# Patient Record
Sex: Male | Born: 1935 | Race: White | Hispanic: No | Marital: Married | Smoking: Never smoker
Health system: Southern US, Community
[De-identification: ages and names within clinical notes are randomized; demographics above are authoritative.]

## PROBLEM LIST (undated history)

## (undated) DIAGNOSIS — I10 Essential (primary) hypertension: Secondary | ICD-10-CM

## (undated) DIAGNOSIS — I509 Heart failure, unspecified: Secondary | ICD-10-CM

## (undated) DIAGNOSIS — E119 Type 2 diabetes mellitus without complications: Secondary | ICD-10-CM

## (undated) HISTORY — PX: CARDIAC SURGERY: SHX584

---

## 2006-02-13 ENCOUNTER — Ambulatory Visit: Payer: Self-pay | Admitting: Specialist

## 2006-02-15 ENCOUNTER — Ambulatory Visit: Payer: Self-pay | Admitting: Specialist

## 2006-10-31 ENCOUNTER — Ambulatory Visit: Payer: Self-pay | Admitting: Ophthalmology

## 2012-12-03 ENCOUNTER — Ambulatory Visit: Payer: Self-pay | Admitting: Anesthesiology

## 2012-12-03 ENCOUNTER — Ambulatory Visit: Payer: Self-pay | Admitting: Ophthalmology

## 2013-03-16 ENCOUNTER — Inpatient Hospital Stay: Payer: Self-pay | Admitting: Internal Medicine

## 2013-03-16 LAB — URINALYSIS, COMPLETE
BACTERIA: NONE SEEN
BILIRUBIN, UR: NEGATIVE
Glucose,UR: 50 mg/dL (ref 0–75)
Nitrite: NEGATIVE
Ph: 5 (ref 4.5–8.0)
Protein: >=500
RBC,UR: 2 /HPF (ref 0–5)
SPECIFIC GRAVITY: 1.019 (ref 1.003–1.030)

## 2013-03-16 LAB — BASIC METABOLIC PANEL
Anion Gap: 6 — ABNORMAL LOW (ref 7–16)
BUN: 12 mg/dL (ref 7–18)
CO2: 28 mmol/L (ref 21–32)
CREATININE: 0.78 mg/dL (ref 0.60–1.30)
Calcium, Total: 9.3 mg/dL (ref 8.5–10.1)
Chloride: 97 mmol/L — ABNORMAL LOW (ref 98–107)
EGFR (Non-African Amer.): >60
Glucose: 167 mg/dL — ABNORMAL HIGH (ref 65–99)
Osmolality: 266 (ref 275–301)
Potassium: 4.1 mmol/L (ref 3.5–5.1)
SODIUM: 131 mmol/L — AB (ref 136–145)

## 2013-03-16 LAB — CBC
HCT: 43.5 % (ref 40.0–52.0)
HGB: 14.6 g/dL (ref 13.0–18.0)
MCH: 32.2 pg (ref 26.0–34.0)
MCHC: 33.5 g/dL (ref 32.0–36.0)
MCV: 96 fL (ref 80–100)
Platelet: 134 10*3/uL — ABNORMAL LOW (ref 150–440)
RBC: 4.52 10*6/uL (ref 4.40–5.90)
RDW: 14.7 % — AB (ref 11.5–14.5)
WBC: 9.2 10*3/uL (ref 3.8–10.6)

## 2013-03-16 LAB — TROPONIN I: TROPONIN-I: 0.02 ng/mL

## 2013-03-17 LAB — CBC WITH DIFFERENTIAL/PLATELET
Basophil #: 0 10*3/uL (ref 0.0–0.1)
Basophil %: 0.1 %
Eosinophil #: 0 10*3/uL (ref 0.0–0.7)
Eosinophil %: 0.2 %
HCT: 44.4 % (ref 40.0–52.0)
HGB: 14.8 g/dL (ref 13.0–18.0)
LYMPHS ABS: 1.8 10*3/uL (ref 1.0–3.6)
LYMPHS PCT: 19.4 %
MCH: 32 pg (ref 26.0–34.0)
MCHC: 33.3 g/dL (ref 32.0–36.0)
MCV: 96 fL (ref 80–100)
MONOS PCT: 7.8 %
Monocyte #: 0.7 x10 3/mm (ref 0.2–1.0)
NEUTROS ABS: 6.6 10*3/uL — AB (ref 1.4–6.5)
Neutrophil %: 72.5 %
Platelet: 128 10*3/uL — ABNORMAL LOW (ref 150–440)
RBC: 4.62 10*6/uL (ref 4.40–5.90)
RDW: 14.8 % — ABNORMAL HIGH (ref 11.5–14.5)
WBC: 9.2 10*3/uL (ref 3.8–10.6)

## 2013-03-17 LAB — MAGNESIUM: Magnesium: 1.5 mg/dL — ABNORMAL LOW

## 2013-03-17 LAB — BASIC METABOLIC PANEL
ANION GAP: 6 — AB (ref 7–16)
BUN: 10 mg/dL (ref 7–18)
Calcium, Total: 8.9 mg/dL (ref 8.5–10.1)
Chloride: 94 mmol/L — ABNORMAL LOW (ref 98–107)
Co2: 30 mmol/L (ref 21–32)
Creatinine: 0.82 mg/dL (ref 0.60–1.30)
EGFR (African American): >60
EGFR (Non-African Amer.): >60
Glucose: 157 mg/dL — ABNORMAL HIGH (ref 65–99)
Osmolality: 263 (ref 275–301)
Potassium: 4.1 mmol/L (ref 3.5–5.1)
Sodium: 130 mmol/L — ABNORMAL LOW (ref 136–145)

## 2013-03-17 LAB — CK TOTAL AND CKMB (NOT AT ARMC)
CK, TOTAL: 71 U/L
CK, Total: 71 U/L
CK, Total: 67 U/L
CK-MB: 2.3 ng/mL (ref 0.5–3.6)
CK-MB: 2.4 ng/mL (ref 0.5–3.6)
CK-MB: 2.3 ng/mL (ref 0.5–3.6)

## 2013-03-17 LAB — PROTIME-INR
INR: 2
Prothrombin Time: 22 secs — ABNORMAL HIGH (ref 11.5–14.7)

## 2013-03-17 LAB — TROPONIN I
TROPONIN-I: 0.02 ng/mL
Troponin-I: 0.04 ng/mL
Troponin-I: 0.04 ng/mL

## 2013-03-18 LAB — CBC WITH DIFFERENTIAL/PLATELET
BASOS ABS: 0 10*3/uL (ref 0.0–0.1)
BASOS PCT: 0.2 %
EOS ABS: 0.1 10*3/uL (ref 0.0–0.7)
Eosinophil %: 0.9 %
HCT: 45.1 % (ref 40.0–52.0)
HGB: 15.2 g/dL (ref 13.0–18.0)
LYMPHS ABS: 1.4 10*3/uL (ref 1.0–3.6)
Lymphocyte %: 13 %
MCH: 32.1 pg (ref 26.0–34.0)
MCHC: 33.7 g/dL (ref 32.0–36.0)
MCV: 95 fL (ref 80–100)
MONO ABS: 1 x10 3/mm (ref 0.2–1.0)
Monocyte %: 9 %
NEUTROS ABS: 8.1 10*3/uL — AB (ref 1.4–6.5)
Neutrophil %: 76.9 %
Platelet: 127 10*3/uL — ABNORMAL LOW (ref 150–440)
RBC: 4.73 10*6/uL (ref 4.40–5.90)
RDW: 15 % — ABNORMAL HIGH (ref 11.5–14.5)
WBC: 10.6 10*3/uL (ref 3.8–10.6)

## 2013-03-18 LAB — BASIC METABOLIC PANEL
ANION GAP: 6 — AB (ref 7–16)
BUN: 7 mg/dL (ref 7–18)
CALCIUM: 9 mg/dL (ref 8.5–10.1)
CO2: 29 mmol/L (ref 21–32)
CREATININE: 0.84 mg/dL (ref 0.60–1.30)
Chloride: 99 mmol/L (ref 98–107)
EGFR (African American): >60
Glucose: 130 mg/dL — ABNORMAL HIGH (ref 65–99)
Osmolality: 268 (ref 275–301)
Potassium: 3.8 mmol/L (ref 3.5–5.1)
SODIUM: 134 mmol/L — AB (ref 136–145)

## 2013-03-18 LAB — PROTIME-INR
INR: 2.1
Prothrombin Time: 22.7 secs — ABNORMAL HIGH (ref 11.5–14.7)

## 2013-03-18 LAB — TSH: Thyroid Stimulating Horm: 2.18 u[IU]/mL

## 2013-03-18 LAB — AMMONIA: AMMONIA, PLASMA: 26 umol/L (ref 11–32)

## 2013-03-18 LAB — MAGNESIUM: MAGNESIUM: 1.7 mg/dL — AB

## 2013-03-18 LAB — URINE CULTURE

## 2013-03-18 LAB — HEMOGLOBIN A1C: Hemoglobin A1C: 7.1 % — ABNORMAL HIGH (ref 4.2–6.3)

## 2013-03-19 LAB — BASIC METABOLIC PANEL
Anion Gap: 6 — ABNORMAL LOW (ref 7–16)
BUN: 15 mg/dL (ref 7–18)
CALCIUM: 8.9 mg/dL (ref 8.5–10.1)
CO2: 28 mmol/L (ref 21–32)
Chloride: 100 mmol/L (ref 98–107)
Creatinine: 1.07 mg/dL (ref 0.60–1.30)
EGFR (African American): >60
EGFR (Non-African Amer.): >60
GLUCOSE: 166 mg/dL — AB (ref 65–99)
Osmolality: 273 (ref 275–301)
POTASSIUM: 3.9 mmol/L (ref 3.5–5.1)
Sodium: 134 mmol/L — ABNORMAL LOW (ref 136–145)

## 2013-03-19 LAB — PROTIME-INR
INR: 2.5
Prothrombin Time: 26.4 secs — ABNORMAL HIGH (ref 11.5–14.7)

## 2013-03-19 LAB — MAGNESIUM: MAGNESIUM: 1.7 mg/dL — AB

## 2013-03-20 LAB — PROTIME-INR
INR: 2.5
Prothrombin Time: 26.6 secs — ABNORMAL HIGH (ref 11.5–14.7)

## 2013-03-20 LAB — MAGNESIUM: Magnesium: 1.7 mg/dL — ABNORMAL LOW

## 2013-03-21 LAB — CBC WITH DIFFERENTIAL/PLATELET
Basophil #: 0 10*3/uL (ref 0.0–0.1)
Basophil %: 0.3 %
EOS ABS: 0.4 10*3/uL (ref 0.0–0.7)
EOS PCT: 4.9 %
HCT: 42.2 % (ref 40.0–52.0)
HGB: 14.2 g/dL (ref 13.0–18.0)
LYMPHS ABS: 1.8 10*3/uL (ref 1.0–3.6)
Lymphocyte %: 23 %
MCH: 32.1 pg (ref 26.0–34.0)
MCHC: 33.6 g/dL (ref 32.0–36.0)
MCV: 96 fL (ref 80–100)
MONOS PCT: 9.6 %
Monocyte #: 0.8 x10 3/mm (ref 0.2–1.0)
NEUTROS ABS: 4.9 10*3/uL (ref 1.4–6.5)
NEUTROS PCT: 62.2 %
Platelet: 131 10*3/uL — ABNORMAL LOW (ref 150–440)
RBC: 4.41 10*6/uL (ref 4.40–5.90)
RDW: 14.9 % — AB (ref 11.5–14.5)
WBC: 8 10*3/uL (ref 3.8–10.6)

## 2013-03-21 LAB — MAGNESIUM: MAGNESIUM: 1.8 mg/dL

## 2013-03-21 LAB — PROTIME-INR
INR: 2.6
Prothrombin Time: 27.3 secs — ABNORMAL HIGH (ref 11.5–14.7)

## 2013-03-22 LAB — CULTURE, BLOOD (SINGLE)

## 2013-03-22 LAB — PROTIME-INR
INR: 2.3
Prothrombin Time: 24.7 secs — ABNORMAL HIGH (ref 11.5–14.7)

## 2013-03-23 LAB — PROTIME-INR
INR: 2.4
PROTHROMBIN TIME: 25.9 s — AB (ref 11.5–14.7)

## 2013-05-06 ENCOUNTER — Ambulatory Visit: Payer: Self-pay | Admitting: Internal Medicine

## 2013-05-07 ENCOUNTER — Inpatient Hospital Stay: Payer: Self-pay | Admitting: Internal Medicine

## 2013-05-07 LAB — COMPREHENSIVE METABOLIC PANEL
ALT: 102 U/L — AB (ref 12–78)
Albumin: 3.4 g/dL (ref 3.4–5.0)
Alkaline Phosphatase: 66 U/L
Anion Gap: 9 (ref 7–16)
BUN: 21 mg/dL — ABNORMAL HIGH (ref 7–18)
Bilirubin,Total: 1.6 mg/dL — ABNORMAL HIGH (ref 0.2–1.0)
CALCIUM: 8.5 mg/dL (ref 8.5–10.1)
CREATININE: 1.77 mg/dL — AB (ref 0.60–1.30)
Chloride: 101 mmol/L (ref 98–107)
Co2: 28 mmol/L (ref 21–32)
EGFR (African American): 42 — ABNORMAL LOW
EGFR (Non-African Amer.): 36 — ABNORMAL LOW
Glucose: 175 mg/dL — ABNORMAL HIGH (ref 65–99)
Osmolality: 283 (ref 275–301)
Potassium: 4.4 mmol/L (ref 3.5–5.1)
SGOT(AST): 142 U/L — ABNORMAL HIGH (ref 15–37)
Sodium: 138 mmol/L (ref 136–145)
Total Protein: 7 g/dL (ref 6.4–8.2)

## 2013-05-07 LAB — APTT
Activated PTT: 39.9 secs — ABNORMAL HIGH (ref 23.6–35.9)
Activated PTT: >160 secs (ref 23.6–35.9)

## 2013-05-07 LAB — PROTIME-INR
INR: 2.9
INR: 2.8
Prothrombin Time: 29.5 secs — ABNORMAL HIGH (ref 11.5–14.7)
Prothrombin Time: 28.8 secs — ABNORMAL HIGH (ref 11.5–14.7)

## 2013-05-07 LAB — CBC
HCT: 42.9 % (ref 40.0–52.0)
HGB: 13.7 g/dL (ref 13.0–18.0)
MCH: 31.1 pg (ref 26.0–34.0)
MCHC: 31.9 g/dL — ABNORMAL LOW (ref 32.0–36.0)
MCV: 97 fL (ref 80–100)
Platelet: 101 10*3/uL — ABNORMAL LOW (ref 150–440)
RBC: 4.41 10*6/uL (ref 4.40–5.90)
RDW: 15.5 % — ABNORMAL HIGH (ref 11.5–14.5)
WBC: 10.3 10*3/uL (ref 3.8–10.6)

## 2013-05-07 LAB — CK-MB
CK-MB: 40 ng/mL — AB (ref 0.5–3.6)
CK-MB: 55.9 ng/mL — ABNORMAL HIGH (ref 0.5–3.6)
CK-MB: 56.9 ng/mL — ABNORMAL HIGH (ref 0.5–3.6)

## 2013-05-07 LAB — TROPONIN I
TROPONIN-I: 6.7 ng/mL — AB
TROPONIN-I: 20 ng/mL — AB
Troponin-I: 14 ng/mL — ABNORMAL HIGH

## 2013-05-07 LAB — CK TOTAL AND CKMB (NOT AT ARMC): CK, TOTAL: 437 U/L — AB

## 2013-05-08 LAB — CBC WITH DIFFERENTIAL/PLATELET
Basophil #: 0 10*3/uL (ref 0.0–0.1)
Basophil %: 0.4 %
EOS ABS: 0.2 10*3/uL (ref 0.0–0.7)
EOS PCT: 2 %
HCT: 40.5 % (ref 40.0–52.0)
HGB: 13.4 g/dL (ref 13.0–18.0)
Lymphocyte #: 1.8 10*3/uL (ref 1.0–3.6)
Lymphocyte %: 19 %
MCH: 31.7 pg (ref 26.0–34.0)
MCHC: 33 g/dL (ref 32.0–36.0)
MCV: 96 fL (ref 80–100)
Monocyte #: 0.7 x10 3/mm (ref 0.2–1.0)
Monocyte %: 7.8 %
Neutrophil #: 6.6 10*3/uL — ABNORMAL HIGH (ref 1.4–6.5)
Neutrophil %: 70.8 %
PLATELETS: 96 10*3/uL — AB (ref 150–440)
RBC: 4.22 10*6/uL — AB (ref 4.40–5.90)
RDW: 15.4 % — ABNORMAL HIGH (ref 11.5–14.5)
WBC: 9.4 10*3/uL (ref 3.8–10.6)

## 2013-05-08 LAB — PROTIME-INR
INR: 2.9
INR: 2.4
PROTHROMBIN TIME: 29.5 s — AB (ref 11.5–14.7)
PROTHROMBIN TIME: 25.8 s — AB (ref 11.5–14.7)

## 2013-05-08 LAB — BASIC METABOLIC PANEL
Anion Gap: 9 (ref 7–16)
BUN: 26 mg/dL — AB (ref 7–18)
CHLORIDE: 101 mmol/L (ref 98–107)
CO2: 27 mmol/L (ref 21–32)
Calcium, Total: 8.4 mg/dL — ABNORMAL LOW (ref 8.5–10.1)
Creatinine: 1.43 mg/dL — ABNORMAL HIGH (ref 0.60–1.30)
EGFR (Non-African Amer.): 47 — ABNORMAL LOW
GFR CALC AF AMER: 54 — AB
Glucose: 114 mg/dL — ABNORMAL HIGH (ref 65–99)
Osmolality: 279 (ref 275–301)
Potassium: 3.6 mmol/L (ref 3.5–5.1)
Sodium: 137 mmol/L (ref 136–145)

## 2013-05-08 LAB — LIPID PANEL
Cholesterol: 137 mg/dL (ref 0–200)
HDL: 30 mg/dL — AB (ref 40–60)
Ldl Cholesterol, Calc: 86 mg/dL (ref 0–100)
TRIGLYCERIDES: 103 mg/dL (ref 0–200)
VLDL Cholesterol, Calc: 21 mg/dL (ref 5–40)

## 2013-05-08 LAB — APTT
ACTIVATED PTT: 111.5 s — AB (ref 23.6–35.9)
Activated PTT: 103.5 secs — ABNORMAL HIGH (ref 23.6–35.9)

## 2013-05-08 LAB — MAGNESIUM
MAGNESIUM: 1.4 mg/dL — AB
Magnesium: 1.1 mg/dL — ABNORMAL LOW

## 2013-05-08 LAB — PHOSPHORUS: PHOSPHORUS: 2.9 mg/dL (ref 2.5–4.9)

## 2013-05-08 LAB — HEMOGLOBIN A1C: Hemoglobin A1C: 6.6 % — ABNORMAL HIGH (ref 4.2–6.3)

## 2013-05-09 LAB — CBC WITH DIFFERENTIAL/PLATELET
BASOS PCT: 0.2 %
Basophil #: 0 10*3/uL (ref 0.0–0.1)
EOS ABS: 0.1 10*3/uL (ref 0.0–0.7)
EOS PCT: 1 %
HCT: 37 % — AB (ref 40.0–52.0)
HGB: 12.5 g/dL — AB (ref 13.0–18.0)
LYMPHS PCT: 17.9 %
Lymphocyte #: 1.5 10*3/uL (ref 1.0–3.6)
MCH: 32.1 pg (ref 26.0–34.0)
MCHC: 33.8 g/dL (ref 32.0–36.0)
MCV: 95 fL (ref 80–100)
MONO ABS: 0.8 x10 3/mm (ref 0.2–1.0)
Monocyte %: 9.3 %
NEUTROS ABS: 6.2 10*3/uL (ref 1.4–6.5)
NEUTROS PCT: 71.6 %
PLATELETS: 93 10*3/uL — AB (ref 150–440)
RBC: 3.9 10*6/uL — ABNORMAL LOW (ref 4.40–5.90)
RDW: 15.2 % — AB (ref 11.5–14.5)
WBC: 8.6 10*3/uL (ref 3.8–10.6)

## 2013-05-09 LAB — BASIC METABOLIC PANEL
Anion Gap: 5 — ABNORMAL LOW (ref 7–16)
BUN: 19 mg/dL — ABNORMAL HIGH (ref 7–18)
CALCIUM: 8 mg/dL — AB (ref 8.5–10.1)
Chloride: 100 mmol/L (ref 98–107)
Co2: 29 mmol/L (ref 21–32)
Creatinine: 1.09 mg/dL (ref 0.60–1.30)
EGFR (African American): >60
EGFR (Non-African Amer.): >60
Glucose: 132 mg/dL — ABNORMAL HIGH (ref 65–99)
OSMOLALITY: 272 (ref 275–301)
POTASSIUM: 3.6 mmol/L (ref 3.5–5.1)
SODIUM: 134 mmol/L — AB (ref 136–145)

## 2013-05-09 LAB — APTT
ACTIVATED PTT: 85.7 s — AB (ref 23.6–35.9)
Activated PTT: 64 secs — ABNORMAL HIGH (ref 23.6–35.9)
Activated PTT: 64.9 secs — ABNORMAL HIGH (ref 23.6–35.9)

## 2013-05-09 LAB — MAGNESIUM: Magnesium: 1.5 mg/dL — ABNORMAL LOW

## 2013-05-10 LAB — BASIC METABOLIC PANEL
Anion Gap: 0 — ABNORMAL LOW (ref 7–16)
BUN: 12 mg/dL (ref 7–18)
CREATININE: 0.94 mg/dL (ref 0.60–1.30)
Calcium, Total: 8 mg/dL — ABNORMAL LOW (ref 8.5–10.1)
Chloride: 102 mmol/L (ref 98–107)
Co2: 34 mmol/L — ABNORMAL HIGH (ref 21–32)
EGFR (African American): >60
EGFR (Non-African Amer.): >60
Glucose: 103 mg/dL — ABNORMAL HIGH (ref 65–99)
Osmolality: 272 (ref 275–301)
POTASSIUM: 3.4 mmol/L — AB (ref 3.5–5.1)
Sodium: 136 mmol/L (ref 136–145)

## 2013-05-10 LAB — MAGNESIUM
Magnesium: 1.4 mg/dL — ABNORMAL LOW
Magnesium: 1.4 mg/dL — ABNORMAL LOW

## 2013-05-10 LAB — PROTIME-INR
INR: 2
Prothrombin Time: 22.4 secs — ABNORMAL HIGH (ref 11.5–14.7)

## 2013-05-10 LAB — APTT: Activated PTT: 71 secs — ABNORMAL HIGH (ref 23.6–35.9)

## 2013-05-10 LAB — POTASSIUM: Potassium: 4.2 mmol/L (ref 3.5–5.1)

## 2013-05-11 LAB — HEMOGLOBIN: HGB: 11.4 g/dL — ABNORMAL LOW (ref 13.0–18.0)

## 2013-05-11 LAB — BASIC METABOLIC PANEL
ANION GAP: 2 — AB (ref 7–16)
BUN: 10 mg/dL (ref 7–18)
CALCIUM: 8.2 mg/dL — AB (ref 8.5–10.1)
CHLORIDE: 102 mmol/L (ref 98–107)
Co2: 32 mmol/L (ref 21–32)
Creatinine: 0.95 mg/dL (ref 0.60–1.30)
GLUCOSE: 122 mg/dL — AB (ref 65–99)
Osmolality: 272 (ref 275–301)
POTASSIUM: 4 mmol/L (ref 3.5–5.1)
Sodium: 136 mmol/L (ref 136–145)

## 2013-05-11 LAB — PROTIME-INR
INR: 1.4
Prothrombin Time: 16.9 secs — ABNORMAL HIGH (ref 11.5–14.7)

## 2013-05-11 LAB — PLATELET COUNT: PLATELETS: 89 10*3/uL — AB (ref 150–440)

## 2013-05-11 LAB — APTT
ACTIVATED PTT: 63.9 s — AB (ref 23.6–35.9)
Activated PTT: 81.2 secs — ABNORMAL HIGH (ref 23.6–35.9)

## 2013-05-12 LAB — BASIC METABOLIC PANEL
ANION GAP: 2 — AB (ref 7–16)
BUN: 9 mg/dL (ref 7–18)
CHLORIDE: 100 mmol/L (ref 98–107)
CO2: 33 mmol/L — AB (ref 21–32)
CREATININE: 0.93 mg/dL (ref 0.60–1.30)
Calcium, Total: 8.5 mg/dL (ref 8.5–10.1)
EGFR (African American): >60
GLUCOSE: 158 mg/dL — AB (ref 65–99)
Osmolality: 272 (ref 275–301)
POTASSIUM: 3.6 mmol/L (ref 3.5–5.1)
SODIUM: 135 mmol/L — AB (ref 136–145)

## 2013-05-12 LAB — APTT
Activated PTT: 50.8 secs — ABNORMAL HIGH (ref 23.6–35.9)
Activated PTT: 100.3 secs — ABNORMAL HIGH (ref 23.6–35.9)

## 2013-05-12 LAB — MAGNESIUM
MAGNESIUM: 1.1 mg/dL — AB
MAGNESIUM: 1.3 mg/dL — AB

## 2013-05-12 LAB — PROTIME-INR
INR: 1.3
Prothrombin Time: 15.5 secs — ABNORMAL HIGH (ref 11.5–14.7)

## 2013-05-13 LAB — BASIC METABOLIC PANEL
Anion Gap: 2 — ABNORMAL LOW (ref 7–16)
BUN: 8 mg/dL (ref 7–18)
CO2: 36 mmol/L — AB (ref 21–32)
Calcium, Total: 8.3 mg/dL — ABNORMAL LOW (ref 8.5–10.1)
Chloride: 98 mmol/L (ref 98–107)
Creatinine: 0.81 mg/dL (ref 0.60–1.30)
EGFR (African American): >60
Glucose: 142 mg/dL — ABNORMAL HIGH (ref 65–99)
OSMOLALITY: 273 (ref 275–301)
Potassium: 3.4 mmol/L — ABNORMAL LOW (ref 3.5–5.1)
Sodium: 136 mmol/L (ref 136–145)

## 2013-05-13 LAB — MAGNESIUM: Magnesium: 1.8 mg/dL

## 2013-05-13 LAB — APTT: ACTIVATED PTT: 77.2 s — AB (ref 23.6–35.9)

## 2013-05-13 LAB — PROTIME-INR
INR: 1.8
Prothrombin Time: 20.6 secs — ABNORMAL HIGH (ref 11.5–14.7)

## 2013-05-14 LAB — APTT: Activated PTT: 157 secs — ABNORMAL HIGH (ref 23.6–35.9)

## 2013-05-14 LAB — BASIC METABOLIC PANEL
Anion Gap: 1 — ABNORMAL LOW (ref 7–16)
BUN: 7 mg/dL (ref 7–18)
CHLORIDE: 95 mmol/L — AB (ref 98–107)
Calcium, Total: 8.5 mg/dL (ref 8.5–10.1)
Co2: 38 mmol/L — ABNORMAL HIGH (ref 21–32)
Creatinine: 0.97 mg/dL (ref 0.60–1.30)
EGFR (African American): >60
EGFR (Non-African Amer.): >60
Glucose: 132 mg/dL — ABNORMAL HIGH (ref 65–99)
Osmolality: 268 (ref 275–301)
Potassium: 3.6 mmol/L (ref 3.5–5.1)
Sodium: 134 mmol/L — ABNORMAL LOW (ref 136–145)

## 2013-05-14 LAB — CBC WITH DIFFERENTIAL/PLATELET
BASOS ABS: 0 10*3/uL (ref 0.0–0.1)
BASOS PCT: 0.4 %
EOS ABS: 0.3 10*3/uL (ref 0.0–0.7)
EOS PCT: 4.1 %
HCT: 34.5 % — AB (ref 40.0–52.0)
HGB: 11.5 g/dL — AB (ref 13.0–18.0)
LYMPHS ABS: 1.5 10*3/uL (ref 1.0–3.6)
Lymphocyte %: 19.1 %
MCH: 32 pg (ref 26.0–34.0)
MCHC: 33.4 g/dL (ref 32.0–36.0)
MCV: 96 fL (ref 80–100)
Monocyte #: 0.9 x10 3/mm (ref 0.2–1.0)
Monocyte %: 10.8 %
NEUTROS ABS: 5.2 10*3/uL (ref 1.4–6.5)
NEUTROS PCT: 65.6 %
PLATELETS: 107 10*3/uL — AB (ref 150–440)
RBC: 3.6 10*6/uL — AB (ref 4.40–5.90)
RDW: 15 % — AB (ref 11.5–14.5)
WBC: 8 10*3/uL (ref 3.8–10.6)

## 2013-05-14 LAB — PROTIME-INR
INR: 2.7
Prothrombin Time: 28 secs — ABNORMAL HIGH (ref 11.5–14.7)

## 2013-06-05 ENCOUNTER — Ambulatory Visit: Payer: Self-pay | Admitting: Internal Medicine

## 2013-10-19 ENCOUNTER — Inpatient Hospital Stay: Payer: Self-pay | Admitting: Specialist

## 2013-10-19 LAB — CK TOTAL AND CKMB (NOT AT ARMC)
CK, TOTAL: 50 U/L
CK, TOTAL: 64 U/L
CK, TOTAL: 64 U/L
CK-MB: 2.3 ng/mL (ref 0.5–3.6)
CK-MB: 2.4 ng/mL (ref 0.5–3.6)
CK-MB: 2 ng/mL (ref 0.5–3.6)

## 2013-10-19 LAB — PROTIME-INR
INR: 1.8
Prothrombin Time: 20.3 secs — ABNORMAL HIGH (ref 11.5–14.7)

## 2013-10-19 LAB — CBC
HCT: 36.4 % — AB (ref 40.0–52.0)
HGB: 11.6 g/dL — ABNORMAL LOW (ref 13.0–18.0)
MCH: 31.5 pg (ref 26.0–34.0)
MCHC: 31.8 g/dL — AB (ref 32.0–36.0)
MCV: 99 fL (ref 80–100)
PLATELETS: 127 10*3/uL — AB (ref 150–440)
RBC: 3.68 10*6/uL — ABNORMAL LOW (ref 4.40–5.90)
RDW: 14.1 % (ref 11.5–14.5)
WBC: 5.7 10*3/uL (ref 3.8–10.6)

## 2013-10-19 LAB — COMPREHENSIVE METABOLIC PANEL
ALBUMIN: 3 g/dL — AB (ref 3.4–5.0)
ALK PHOS: 74 U/L
Anion Gap: 8 (ref 7–16)
BUN: 11 mg/dL (ref 7–18)
Bilirubin,Total: 0.6 mg/dL (ref 0.2–1.0)
CHLORIDE: 102 mmol/L (ref 98–107)
CREATININE: 1.05 mg/dL (ref 0.60–1.30)
Calcium, Total: 8.4 mg/dL — ABNORMAL LOW (ref 8.5–10.1)
Co2: 29 mmol/L (ref 21–32)
EGFR (Non-African Amer.): >60
GLUCOSE: 112 mg/dL — AB (ref 65–99)
OSMOLALITY: 278 (ref 275–301)
Potassium: 3.9 mmol/L (ref 3.5–5.1)
SGOT(AST): 38 U/L — ABNORMAL HIGH (ref 15–37)
SGPT (ALT): 68 U/L — ABNORMAL HIGH
Sodium: 139 mmol/L (ref 136–145)
Total Protein: 6.7 g/dL (ref 6.4–8.2)

## 2013-10-19 LAB — URINALYSIS, COMPLETE
Bilirubin,UR: NEGATIVE
GLUCOSE, UR: NEGATIVE mg/dL (ref 0–75)
Ketone: NEGATIVE
Nitrite: NEGATIVE
PH: 5 (ref 4.5–8.0)
Protein: NEGATIVE
SPECIFIC GRAVITY: 1.013 (ref 1.003–1.030)
SQUAMOUS EPITHELIAL: NONE SEEN

## 2013-10-19 LAB — TSH: THYROID STIMULATING HORM: 4.24 u[IU]/mL

## 2013-10-19 LAB — TROPONIN I
Troponin-I: 0.02 ng/mL
Troponin-I: 0.03 ng/mL

## 2013-10-19 LAB — PRO B NATRIURETIC PEPTIDE: B-Type Natriuretic Peptide: 577 pg/mL — ABNORMAL HIGH (ref 0–450)

## 2013-10-20 LAB — COMPREHENSIVE METABOLIC PANEL
ALBUMIN: 2.9 g/dL — AB (ref 3.4–5.0)
ALT: 62 U/L
Alkaline Phosphatase: 76 U/L
Anion Gap: 9 (ref 7–16)
BILIRUBIN TOTAL: 0.6 mg/dL (ref 0.2–1.0)
BUN: 13 mg/dL (ref 7–18)
CO2: 30 mmol/L (ref 21–32)
Calcium, Total: 8.3 mg/dL — ABNORMAL LOW (ref 8.5–10.1)
Chloride: 99 mmol/L (ref 98–107)
Creatinine: 1.13 mg/dL (ref 0.60–1.30)
EGFR (African American): >60
GLUCOSE: 136 mg/dL — AB (ref 65–99)
Osmolality: 278 (ref 275–301)
Potassium: 3.4 mmol/L — ABNORMAL LOW (ref 3.5–5.1)
SGOT(AST): 35 U/L (ref 15–37)
Sodium: 138 mmol/L (ref 136–145)
Total Protein: 6.4 g/dL (ref 6.4–8.2)

## 2013-10-20 LAB — CBC WITH DIFFERENTIAL/PLATELET
BASOS ABS: 0 10*3/uL (ref 0.0–0.1)
BASOS PCT: 0.2 %
EOS ABS: 0.2 10*3/uL (ref 0.0–0.7)
Eosinophil %: 2.6 %
HCT: 35.3 % — AB (ref 40.0–52.0)
HGB: 11.3 g/dL — ABNORMAL LOW (ref 13.0–18.0)
LYMPHS ABS: 1 10*3/uL (ref 1.0–3.6)
LYMPHS PCT: 16.2 %
MCH: 31.6 pg (ref 26.0–34.0)
MCHC: 32.1 g/dL (ref 32.0–36.0)
MCV: 99 fL (ref 80–100)
MONO ABS: 0.6 x10 3/mm (ref 0.2–1.0)
Monocyte %: 10.5 %
NEUTROS PCT: 70.5 %
Neutrophil #: 4.2 10*3/uL (ref 1.4–6.5)
PLATELETS: 120 10*3/uL — AB (ref 150–440)
RBC: 3.58 10*6/uL — AB (ref 4.40–5.90)
RDW: 14.1 % (ref 11.5–14.5)
WBC: 6 10*3/uL (ref 3.8–10.6)

## 2013-10-20 LAB — LIPID PANEL
Cholesterol: 138 mg/dL (ref 0–200)
HDL Cholesterol: 35 mg/dL — ABNORMAL LOW (ref 40–60)
Ldl Cholesterol, Calc: 88 mg/dL (ref 0–100)
Triglycerides: 77 mg/dL (ref 0–200)
VLDL Cholesterol, Calc: 15 mg/dL (ref 5–40)

## 2013-10-20 LAB — PRO B NATRIURETIC PEPTIDE: B-TYPE NATIURETIC PEPTID: 926 pg/mL — AB (ref 0–450)

## 2013-10-21 LAB — PROTIME-INR
INR: 1.8
PROTHROMBIN TIME: 20.5 s — AB (ref 11.5–14.7)

## 2013-10-21 LAB — BASIC METABOLIC PANEL
ANION GAP: 10 (ref 7–16)
BUN: 16 mg/dL (ref 7–18)
CALCIUM: 8.2 mg/dL — AB (ref 8.5–10.1)
CHLORIDE: 96 mmol/L — AB (ref 98–107)
CO2: 31 mmol/L (ref 21–32)
CREATININE: 1.02 mg/dL (ref 0.60–1.30)
Glucose: 114 mg/dL — ABNORMAL HIGH (ref 65–99)
OSMOLALITY: 276 (ref 275–301)
POTASSIUM: 3.3 mmol/L — AB (ref 3.5–5.1)
Sodium: 137 mmol/L (ref 136–145)

## 2013-10-24 LAB — URINE CULTURE

## 2014-05-29 NOTE — H&P (Signed)
PATIENT NAME:  James [name], [name] MR#:  [id number] DATE OF BIRTH:  [birth year]  DATE OF ADMISSION:  03/16/2013  PRIMARY CARE PHYSICIAN: Dr. Maudie Flakesale Feldpausch.   REFERRING EMERGENCY ROOM PHYSICIAN: Dr. Cyril LoosenKinner.   CHIEF COMPLAINT: Altered mental status.   HISTORY OF PRESENT ILLNESS: The patient is a 81 year old Caucasian male with a past medical history of hypertension, chronic atrial fibrillation on Coumadin, diabetes mellitus, COPD and history of heart valve repair. He is brought into the ER by his wife via EMS for being confused for the past week or so. The patient's wife was concerned that for the past 7 days his mental status has been getting worse and he is not eating much. Denies any head injury. She called EMS and at the time, the patient was complaining of headache and neck pain and was also complaining of frequent urination and abdominal pain. As per EMS report, the patient has chronic headache and neck pain, but the neck pain was worse while they were bringing him to the ER. At that time, the patient's temperature was 97.5. Blood sugar was 172. The patient is in atrial fibrillation which is chronic to him. The patient's blood pressure was 120/98 during that time. After coming to the ER, the patient had a CAT scan of the head without contrast which has revealed no focal acute intracranial abnormalities. Urinalysis has revealed trace leuk esterase, and urine cultures were obtained. As the patient was given Haldol 2.5 mg IV and lorazepam 1 mg IV push in the ER, the patient was very lethargic during my examination and I was unable to get any history from him. The patient has received 1 liter fluid bolus, and during my examination, his wife is at bedside. She has reported that the patient takes Coumadin on a daily basis, and she could not recall his home medication list. Home medications are not reconciled during my examination. The patient's blood pressure was high, and it was around 190 systolic during my  examination. Wife is reporting that he did not take any of his medications last night. Blood cultures and urine cultures were ordered, and IV levofloxacin was ordered.   PAST MEDICAL HISTORY: Chronic atrial fibrillation on Coumadin, diabetes mellitus, heart valve repair, COPD, hypertension.   PAST SURGICAL HISTORY: Heart valve repair.   PSYCHOSOCIAL HISTORY: Lives at home with wife. He used to smoke but quit smoking 30 years ago. No history of alcohol or illicit drug usage as reported by wife.   FAMILY HISTORY: Hypertension runs in his family.   HOME MEDICATION LIST: Not available other than Coumadin.   REVIEW OF SYSTEMS: Unobtainable.   PHYSICAL EXAMINATION:  VITAL SIGNS: Temperature 97.8, pulse 68 to 103, respirations 18, blood pressure 191/86, pulse ox 93 percent on room air.  GENERAL APPEARANCE: Not in any acute distress. Moderately built and nourished. Very lethargic. Arousable but falling asleep.  HEENT: Normocephalic, atraumatic. Pupils are equally reactive to light and accommodation. No scleral icterus. No conjunctival injection. No sinus tenderness. No postnasal drip. Dry mucous membranes.  NECK: Supple. No JVD. No thyromegaly. Spontaneously, the patient is turning his neck from side to side and according to the wife, the patient has chronic neck pain.  LUNGS: Clear to auscultation bilaterally. No accessory muscle usage.  CARDIOVASCULAR: Irregularly irregular. Positive murmur.  GASTROINTESTINAL: Soft. Bowel sounds are positive in all 4 quadrants. Nontender, nondistended. No masses felt.  NEUROLOGIC: The patient is very lethargic with altered mental status. Arousable but falling asleep. The patient has received Haldol and lorazepam  IV in the ER. Spontaneously moving his upper and lower extremities. Reflexes are 2+.  MUSCULOSKELETAL: No joint effusion, tenderness or erythema.  SKIN: Warm to touch. Normal turgor. No rashes. No lesions.   LABORATORIES AND IMAGING STUDIES: CAT scan of  the head without contrast has revealed no focal acute intracranial abnormality. Chronic diffuse atrophy. Chronic bilateral periventricular white matter small vessel ischemic change is noticed. Chest x-ray, portable: No acute disease. A 12-lead EKG has revealed Afib with right bundle branch block. No acute ST-T wave changes. Troponin is 0.02. WBC 9.2, hemoglobin 14.6, hematocrit 43.5, platelets are 134. Urinalysis yellow in color, clear in appearance, nitrites negative, leuk esterase trace, protein greater than 500. Urine glucose is 50 mg/dL. Chem-8: Serum glucose 167, BUN and creatinine are normal, sodium 131, potassium 4.1. Anion gap is 6. The rest of the chem-8 is normal.   ASSESSMENT AND PLAN: A 81 year old Caucasian male brought into the Emergency Room via EMS as he was with altered mental status and decreased p.o. intake for the past 1 week. The patient was complaining of headache and neck pain to EMS, but the patient has chronic history of headache and neck pain.  1. Altered mental status, probably from acute cystitis/metabolic encephalopathy. Will keep him n.p.o. as the patient is very lethargic. Will provide him intravenous fluids while he is n.p.o. Blood cultures and urine cultures were ordered. Will provide intravenous levofloxacin.  2. Hypertension, uncontrolled: Probably because he was not compliant with his medications. The patient being n.p.o. as there is a risk for aspiration. Will give him intravenous Cardizem as needed for atrial fibrillation with rapid ventricular response/uncontrolled blood pressure with systolic blood pressure greater than 165.  3. Chronic history of diabetes mellitus: The patient will be on sliding scale insulin.  4. Chronic atrial fibrillation, rate controlled, on Coumadin: PT/INR is ordered stat which is pending at this time. Coumadin management after reviewing PT/INR results.  5. Chronic history of chronic obstructive pulmonary disease: Will provide him DuoNeb  treatments q.6 hours p.r.n. for shortness of breath.  6. He is DO NOT RESUSCITATE. Wife is medical power of attorney.  7. Will provide him gastrointestinal prophylaxis.  8. Deep vein thrombosis prophylaxis: The patient is on Coumadin. Awaiting PT/INR results.   Plan of care discussed in detail with the patient's wife at bedside. She verbalized understanding of the plan.    ____________________________ Ramonita Lab, MD ag:gb D: 03/17/2013 00:42:12 ET T: 03/17/2013 01:12:54 ET JOB#: [id number]  cc: Ramonita Lab, MD, <Dictator> Marina Goodell, MD Ramonita Lab MD ELECTRONICALLY SIGNED 04/02/2013 18:46

## 2014-05-29 NOTE — Discharge Summary (Signed)
PATIENT NAME:  James [name], James [name] MR#:  [id number] DATE OF BIRTH:  1933-11-22  DATE OF ADMISSION:  05/07/2013  DATE OF DISCHARGE:  05/14/2013  ADMISSION DIAGNOSIS:   Non-ST elevation myocardial infarction.    DISCHARGE DIAGNOSES:  1.  Non-ST elevation myocardial infarction.  2.  Atrial fibrillation and RVR.  3.  Acute on chronic systolic heart failure.  4.  Pneumonia.  5.  Hypertension.  6.  History of diabetes.   CONSULTATIONS: Cardiology.   LABORATORIES: White blood cells 8, hemoglobin 11.5, hematocrit 35, platelets 107,000.   INR is 2.7.   HOSPITAL COURSE: A 81 year old male brought into the ER with chest pressure. For further details, please refer to H and P.   TREATMENT PLAN: 1.  Non-ST-elevation myocardial infarction. The patient had an elevation in troponins. He underwent a cardiac catheterization , which showed insignificant CAD, EF fraction was 40%-45%. He was continued on his outpatient medications.  2. Acute on chronic atrial fibrillation with RVR. The patient goes in and out of RVR with a SVT especially when he ambulates. The patient says that he can feel his palpitations. He spent significant time in the stepdown unit trying to get his heart rates under control. When he is at rest his heart rates are 80s-90s. However, on ambulation it does 160s, but subsequently when he is at rest. The patient was started amiodarone drip and then transitioned to amiodarone b.i.d. He is also on diltiazem for better heart rate control and blood pressure control. Cardiology has consulted. The patient is already on Coumadin with an INR of 2.7 at discharge. The patient is well aware that his heart rate does increase and says that on the date of discharge he wanted to go home. Initially he want to go home at discharge. I did talk to the patient that due to his elevation and heart rate, he is at high risk of cardiac arrest as well as pulmonary arrest. The patient accepts these risks and he says he is ready  to go with God and did discuss in this in great detail with his wife. He also discussed this case managed and palliative care. The patient did not want to go with hospice, but once again, it was reiterated to him and his wife that he is at a high risk for cardiac and pulmonary arrest.  2.  History of mechanical heart valve, a St. Jude's heart valve. The patient is on Coumadin. INR is 2.7. He was bridged with heparin.  3.  History of diabetes. The patient will be on sliding scale insulin.  4.  Hypertension. The patient will continued on his outpatient medication.  5.  History of COPD, not an exacerbation.  6.  Hypomagnesemia, which was repleted in the ICU.  7.  History of CHF, systolic acute on chronic with EF of 40%-45%. His chest x-ray was positive for effusion and questionable pneumonia. He was treated with IV Lasix as well as antibiotics for hospital-acquired pneumonia.  8.  Toe ulcer, status post I and D by podiatry on 05/13/2013.    DISCHARGE MEDICATIONS:  1.  Coumadin 3 mg daily.  2.  Tramadol 50 mg b.i.d.  3.  Amiodarone 200 mg b.i.d.  4.  Diltiazem 300 mg daily.  5.  Simvastatin 20 mg at bedtime.  6.  Aspirin 81 mg daily.  7.  Patient was instructed to use sliding scale insulin.    DISCHARGE DIET: Low sodium, ADA diet.   DISCHARGE OXYGEN:   2 liters nasal cannula.  DISCHARGE ACTIVITY: No exertion or heavy lifting.   FOLLOW UP:  Patient will follow up with his primary care physician in 1 week.   TIME SPENT: Approximately 35 minutes.   DISPOSITION:  The patient wanted to go home. We did not discharge him AMA. He accepts the risks and is now DO NOT RESUSCITATE.  ____________________________ James ContesSital P. Juliene PinaMody, MD spm:tc D: 05/14/2013 14:32:43 ET T: 05/14/2013 14:54:10 ET JOB#: [id number]  cc: Solita Macadam P. Juliene PinaMody, MD, <Dictator>             Marina Goodellale E. Feldpausch, MD James ContesSITAL P Akaylah Lalley MD ELECTRONICALLY SIGNED 05/15/2013 12:50

## 2014-05-29 NOTE — Consult Note (Signed)
PATIENT NAME:  James [name], James [name] MR#:  [id number] DATE OF BIRTH:  13-Oct-1933  DATE OF CONSULTATION:  05/07/2013  REFERRING PHYSICIAN:  Altamese DillingVaibhavkumar Vachhani, MD CONSULTING PHYSICIAN:  Lamar BlinksBruce J. Abou Sterkel, MD  REASON FOR CONSULTATION: Acute subendocardial myocardial infarction with valvular heart disease.   CHIEF COMPLAINT: "I had chest pain."   HISTORY OF PRESENT ILLNESS: This is a 81 year old male with known coronary artery disease, hypertension, hyperlipidemia, atrial fibrillation and acute onset of substernal chest discomfort, weakness, fatigue and shortness of breath awakening him from sleep with significant amount of diaphoresis multiple times. When seen in the Emergency Room, he had no evidence of acute myocardial infarction by EKG with sinus rhythm with a right bundle branch block. The patient has been treated with sotalol for maintenance of normal sinus rhythm. He also is on Coumadin for further risk reduction in stroke and thrombosis of mitral valve with replacement of St. Jude valve. The patient's INR is 2.9. The patient was seen and had a troponin of 7.4 with symptoms consistent with NSTEMI of the possible right coronary artery. The patient has had full relief with nitroglycerin and heparin.   REVIEW OF SYSTEMS: The remainder of review is negative for vision change, ringing in the ears, hearing loss, cough, congestion, heartburn, nausea, vomiting, diarrhea, bloody stool, stomach pain, extremity pain, leg weakness, cramping of the buttocks, known blood clots, headaches, blackouts, dizzy spells, nosebleeds, congestion, trouble swallowing, frequent urination, urination at night, muscle weakness, numbness, anxiety, depression, skin lesions or skin rashes.   PAST MEDICAL HISTORY: 1.  Mitral valve replacement with St. Jude. 2.  Hypertension.  3.  Hyperlipidemia.  4.  Atrial fibrillation.  5.  Valvular heart disease and coronary artery disease.   FAMILY HISTORY: No family members with early  onset of cardiovascular disease or hypertension.   SOCIAL HISTORY: Currently denies alcohol or tobacco use.   ALLERGIES: As listed.   MEDICATIONS: As listed.   PHYSICAL EXAMINATION: VITAL SIGNS: Blood pressure is 122/68 bilaterally and heart rate 110 upright and reclining and slightly irregular.  GENERAL: He is a well appearing male in no distress.  HEENT: No icterus, thyromegaly, ulcers, hemorrhage, or xanthelasma.  HEART: Regular rate and rhythm. Norma St. Jude S1 and normal S2 with a 2/6 apical murmur consistent with mitral regurgitation. PMI is diffuse. Carotid upstroke normal without bruit. Jugular venous pressure is normal.  LUNGS: Have few basilar crackles with normal respirations.  ABDOMEN: Soft and nontender without hepatosplenomegaly or masses. Abdominal aorta is normal size without bruit.  EXTREMITIES: 2+ radial, femoral and dorsal pedal pulses with no lower extremity edema, cyanosis, clubbing or ulcers.  NEUROLOGIC: He is oriented to time, place, and person with normal mood and affect.   ASSESSMENT: This is a 81 year old male with hypertension, hyperlipidemia, coronary artery disease, mitral valve replacement and atrial fibrillation having an acute subendocardial myocardial infarction with elevated troponin.   RECOMMENDATIONS: 1.  Serial ECG and enzymes to assess extent of myocardial infarction. 2.  Heparin and discontinuation of Coumadin for myocardial infarction and thrombosis risk.  3.  Continue sotalol for heart rate control and maintenance of normal rhythm.  4.  Proceed to cardiac catheterization to assess coronary anatomy and further treatment thereof as necessary. The patient understands the risks and benefits of cardiac catheterization. These include the possibility of death, stroke, heart attack, infection, bleeding or blood clot. He is at low risk for conscious sedation.  5.  Echocardiogram for LV systolic dysfunction.  6.  Further treatment options after  above.  ____________________________ Lamar Blinks, MD bjk:sb D: 05/07/2013 07:25:32 ET T: 05/07/2013 07:47:42 ET JOB#: [id number]  cc: Lamar Blinks, MD, <Dictator> Lamar Blinks MD ELECTRONICALLY SIGNED 05/11/2013 13:02

## 2014-05-29 NOTE — Consult Note (Signed)
PATIENT NAME:  James, [name] MR#:  [name] DATE OF BIRTH:  [birth year]  DATE OF CONSULTATION:  10/20/2013  REFERRING PHYSICIAN:  Emergency Room  CONSULTING PHYSICIAN:  Rashan Rounsaville D. Juliann Pares, MD  CARDIOLOGIST: Dr. Gwen Pounds.  INDICATION FOR CONSULTATION: Leg edema, swelling, shortness of breath.   HISTORY OF PRESENT ILLNESS: The patient is a 81 year old male with known history of chronic systolic heart failure, ejection fraction of 40% to 45%, COPD, history of mechanical valve replacement, on Coumadin, atrial fibrillation, diabetes, and hypertension who states he been having worsening leg swelling and shortness of breath over the last several days. Because of worsening swelling, he also complained of hallucinations visually, he finally decided to come to the Emergency Room for evaluation. He presented for evaluation including venous Dopplers which reportedly were negative. He has had swelling of his legs and his arms, especially on the left side, so subsequently he was admitted for heart failure.   PAST MEDICAL HISTORY: Chronic systolic heart failure, cardiomyopathy, atrial fibrillation, COPD, mechanical valve replacement with Coumadin, diabetes, hypertension.   PAST SURGICAL HISTORY: Mechanical mitral valve replacement.   ALLERGIES: SULFA.  SOCIAL HISTORY:  Lives with his wife. Quit smoking 20 years ago. Disabled. No alcohol consumption. He is a DNR, as per the patient.   FAMILY HISTORY: Hypertension.   MEDICATIONS: Coumadin 2 mg a day, tramadol 50 every 6 hours as needed, tamsulosin 0.4 mg daily, diltiazem 200 mg a day, aspirin 81 mg a day, amiodarone 400 a day.   REVIEW OF SYSTEMS: No blackout spells or syncope. No nausea or vomiting. No fever. No chills. No sweats. No weight loss. No weight gain. No hemoptysis or hematemesis. No bright red blood per rectum. No vision change or hearing change. No sputum production or cough. He has had leg swelling, leg edema. He has some shortness of breath.    PHYSICAL EXAMINATION: VITAL SIGNS: Blood pressure 140/60, pulse 75, respiratory rate 16, afebrile.  HEENT: Normocephalic, atraumatic. Pupils equal and reactive to light.  NECK: Supple.  LUNGS: Clear.  HEART: Regular rate and rhythm.  ABDOMEN: Benign.  EXTREMITIES: Within normal limits with 2 to 3+ edema.  NEUROLOGIC: Intact.  SKIN: Normal.   DIAGNOSTIC DATA: LFTs: Albumin 3. AST and ALT slightly elevated at 38 and 50. Troponin 0.02. BMP: Glucose 112. BNP 577. Calcium 8.4. White count 5.7, hemoglobin 11.6, hematocrit 36.4, platelet count 127,000. Pro time was 20.3. INR 1.8. Urinalysis essentially negative.   Chest x-ray consistent with congestive heart failure, cardiomegaly.   Ultrasound of his lower extremities showed no DVT.   EKG: Atrial fibrillation, rate of 75, right bundle branch block, nonspecific ST-T wave changes.   ASSESSMENT: 1.  Heart failure. 2.  Cardiomyopathy. 3.  Swelling. 4.  Atrial fibrillation. 5.  Benign prostatic hypertrophy. 6.  Visual hallucinations. 7.  History of cystitis.   PLAN: 1.  Agree with admit. Treat for heart failure. Continue diuretic therapy. Agree with Doppler, which was negative for deep vein thrombosis, as well as abdomen.  2.  Continue small dose of Zaroxolyn as well as diuretics for heart failure. Compression stockings will also be helpful. Consider echocardiogram.  3.  Lower and upper extremity swelling. Rule out deep venous thrombosis.  4.  Acute cystitis. Consider antibiotic therapy.  5.  Chronic atrial fibrillation, on Coumadin therapy. Continue current therapy with INR.  6.  Continue amiodarone therapy.  7.  For benign prostatic hypertrophy, continue tamsulosin therapy.  8.  For mechanical mitral valve, agree with continuing amiodarone therapy.  9.  Visual hallucinations. Continue current therapy as necessary. Will need psychiatry input.  10.  Continue heart failure therapy and have the patient continue treatment and see how the  patient responds. ____________________________ Bobbie Stackwayne D. Juliann Paresallwood, MD ddc:sb D: 10/20/2013 12:27:54 ET T: 10/20/2013 13:46:48 ET JOB#: [id number]  cc: Lorraine Terriquez D. Juliann Paresallwood, MD, <Dictator> Alwyn PeaWAYNE D Jacquiline Zurcher MD ELECTRONICALLY SIGNED 11/20/2013 14:36

## 2014-05-29 NOTE — H&P (Signed)
PATIENT NAME:  James, [name] MR#:  [id number] DATE OF BIRTH:  12-Jan-1934  DATE OF ADMISSION:  10/19/2013  PRIMARY CARE PHYSICIAN: Maudie Flakes, MD  REFERRING PHYSICIAN: Daryel November, MD   CHIEF COMPLAINT:  Worsening of lower extremity swelling, left upper extremity swelling.    HISTORY OF PRESENT ILLNESS: The patient is a 81 year old, very pleasant Caucasian male who came to the ED with a chief complaint of worsening of his lower extremity swelling, more on the left lower extremity and also left upper extremity.  Also, he noticed abdominal swelling and redness in the abdominal area on the left upper extremity.  The patient denies any chest pain or shortness of breath.  He is compliant with his medications including Coumadin.  He also is complaining of a visual hallucinations. The patient sees birds flying in front of him and crawling on his abdomen.   But the patient is refusing any psychiatric consult at this point of time.  Wife is at bedside, denies any other complaints.  Venous Dopplers are negative for DVT.   PAST MEDICAL HISTORY: Chronic systolic congestive heart failure with ejection fraction 40% to 45% as of April 2015 echocardiogram, COPD, mechanical heart valve with a [employer]. Jude heart valve on Coumadin, chronic atrial fibrillation, diabetes mellitus, hypertension.   PAST SURGICAL HISTORY: Mechanical heart valve replacement.  ALLERGIES:  SULFA.   PSYCHOSOCIAL HISTORY: Lives at home with spouse.  He used to smoke, but quit smoking approximately 20 years ago.  Denies alcohol or illicit drug use.   CODE STATUS: DO NOT RESUSCITATE.   FAMILY HISTORY: Hypertension runs in his family.   HOME MEDICATIONS:  Coumadin 2 mg p.o. once daily, Tramadol 50 mg 1 tablet p.o. every 6 hours, tamsulosin 0.4 mg 1 capsule p.o. once daily in the evening, diltiazem 300 mg 1 capsule p.o. once daily, aspirin 81 mg once daily, amiodarone 400 mg p.o. once daily.   REVIEW OF SYSTEMS:  CONSTITUTIONAL:  Denies any fever or fatigue. Complaining of weakness.  EYES: Denies blurry vision, double vision, glaucoma.  ENT: Denies epistaxis, discharge, or postnasal drip.  RESPIRATORY: Has chronic history of COPD.  CARDIOVASCULAR: Denies any chest pain or palpitations. Complaining of lower extremity swelling which is getting worse.   GASTROINTESTINAL: Denies nausea, vomiting, diarrhea, abdominal pain. Complaining of increased abdominal girth and denies any hematemesis or melena.  GENITOURINARY: No dysuria, hematuria, renal calculi.  ENDOCRINE: Denies polyuria, nocturia. Has chronic history of diabetes mellitus.  HEMATOLOGIC AND LYMPHATIC: Denies anemia, has easy bruising as he is on Coumadin. Denies any bleeding.  INTEGUMENTARY: No acne, rash but has erythema on his left upper extremity, left lower extremity and on the abdomen.  MUSCULOSKELETAL: No joint pain in the neck and back. Denies any gout.  NEUROLOGIC: Denies any vertigo, ataxia, weakness, dysarthria.  PSYCHIATRIC:  No ADD or OCD, but complaining of visual hallucinations.  PHYSICAL EXAMINATION:   VITAL SIGNS: Temperature 97.3, pulse 76, respirations 20, blood pressure 143/61, pulse oximetry 94%.  GENERAL APPEARANCE: Not in acute distress. Moderately built and no distress.  HEENT: Normocephalic, atraumatic. Pupils are equally reacting to light and accommodation. No scleral icterus. No conjunctival injection. No sinus tenderness. No postnasal drip.  NECK: Supple. No JVD. Range of motion is intact.  Trachea is midline.  No thyromegaly.   LUNGS: Minimal rales and rhonchi at the bases. Good air entry.  CARDIOVASCULAR: Irregularly irregular. No murmurs.  GASTROINTESTINAL: Soft.  Bowel sounds are positive in all 4 quadrants. Abdomen is distended with diffuse erythema on  the anterior abdominal wall extending to the back.  No masses felt, no positive weeping, but no pustules.    NEUROLOGIC:  Awake, alert and oriented x 3. Cranial nerves II through XII  are grossly intact. Motor and sensory are intact. Reflexes are 2+ complaining of visual hallucination.  EXTREMITIES: 3+ pitting edema is present.  The edema is extending from the lower extremities up to the abdomen including the scrotum.  The left lower extremity and upper extremity are more edematous and erythematous.  PSYCHIATRIC: Normal mood and affect. Complaining of visual hallucinations.  LABORATORY DATA AND IMAGING STUDIES:  LFTs: Albumin is 3.0, AST and ALT are elevated at 38 and 50.  Troponin less than 0.02.  BMP: glucose 112 and BNP is elevated at 577. Calcium at 8.4.  The rest of the BMP is normal.  WBC 5.7, hemoglobin 11.6 and hematocrit 36.4, platelets at 127, prothrombin time is 20.3, INR 1.8.   URINALYSIS: Yellow in color, hazy in appearance ketones are negative. Blood is 1+. Nitrites are negative, mucus is present.   Chest x-ray PA and lateral views consistent with congestive heart failure with mild interstitial edema and small bilateral pleural effusions.    Ultrasound of the left lower extremity, no evidence of deep vein thrombosis.  Ultrasound of the left upper extremity, no evidence of deep vein thrombosis. Ventricular lead EKG: Atrial fibrillation at 76 beats per minute and right bundle branch block.   ASSESSMENT AND PLAN: A 81 year old, pleasant Caucasian male came into the ED with a chief complaint of worsening of lower extremity edema, associated with some erythema, abdominal and scrotal swelling with weeping and erythema on the left upper and lower extremities.  Deep venous thromboses were ruled out in both left upper extremity and lower extremity with negative Dopplers.  The patient is complaining of visual hallucinations but refusing any psychiatric consultation at this time.  1.  Acute exacerbation of systolic on chronic congestive heart failure with anasarca.  An ejection fraction is 40% to 45% as of April 2015.  We will admit the patient to telemetry.  We will provide him  Lasix of 40 mg IV every 12 hours. We will add a small dose of Zaroxolyn, consult Dr. Darrold Junker, continue aspirin.  We will check daily weights and strict intake and outputs.  2.  Left upper extremity and lower extremity edema and erythema.  Deep venous thrombosis is rule out.  The patient is currently on Coumadin, but INR is at 1.8.  3.  Abdominal, left upper extremity and lower extremity cellulitis.  We will provide him intravenous Rocephin.  4.  Acute cystitis.  We will get urine culture and sensitivity and we will provide empiric antibiotic IV Rocephin in the meantime.    5.  Chronic atrial fibrillation, rate controlled.  INR is not therapeutic.  We will continue his home medication with Cardizem 300 mg extended release on amiodarone.  We will increase Coumadin dose from 2 to 2.5 mg until INR is therapeutic. We will check daily PT-INR.   6.  Benign prostatic hypertrophy. Continue tamsulosin.  7.  Visual hallucination.  The patient is claiming that he was seen by psychiatry 2 times in the past with no significant improvement of his hallucinations and refusing any psychiatric consult at this point of time.  He, thinks it is an adverse effect of his home medication tamsulosin.  I have reviewed adverse effects of tamsulosin and could not find visual hallucinations as its adverse effect.  We will provide him  gastrointestinal prophylaxis and deep vein thrombosis prophylaxis is not needed at this time as he is on Coumadin.  INR is at 1.8.   CODE STATUS:  He is DO NOT RESUSCITATE and his wife is the medical power of attorney.   TOTAL TIME SPENT ON ADMISSION:  50 minutes.   ____________________________ Ramonita LabAruna Aspen Lawrance, MD ag:DT D: 10/19/2013 16:07:33 ET T: 10/19/2013 16:44:43 ET JOB#: [id number]  cc: Ramonita LabAruna Leatta Alewine, MD, <Dictator> Marina Goodellale E. Feldpausch, MD Marcina MillardAlexander Paraschos, MD Ramonita LabARUNA Maite Burlison MD ELECTRONICALLY SIGNED 10/27/2013 13:25

## 2014-05-29 NOTE — Consult Note (Signed)
Chief Complaint:  Subjective/Chief Complaint Shortness of breath improved swelling in the legs improve still complains of mild left arm swelling. states to feel better.   VITAL SIGNS/ANCILLARY NOTES: **Vital Signs.:   15-Sep-15 11:44  Vital Signs Type Routine  Temperature Temperature (F) 98  Celsius 36.6  Temperature Source oral  Pulse Pulse 75  Respirations Respirations 18  Systolic BP Systolic BP 681  Diastolic BP (mmHg) Diastolic BP (mmHg) 67  Mean BP 90  Pulse Ox % Pulse Ox % 91  Pulse Ox Activity Level  At rest  Oxygen Delivery Room Air/ 21 %  *Intake and Output.:   15-Sep-15 11:15  Oral Intake      In:  240  Percentage of Meal Eaten  100   Brief Assessment:  GEN well developed, well nourished, no acute distress, obese   Cardiac Irregular   Respiratory normal resp effort  rhonchi   Gastrointestinal Normal   Gastrointestinal details normal Soft  Nontender  Nondistended  No masses palpable   EXTR negative cyanosis/clubbing, positive edema   Lab Results: Thyroid:  14-Sep-15 10:41   Thyroid Stimulating Hormone 4.24 (0.45-4.50 (IU = International Unit)  ----------------------- Pregnant patients have  different reference  ranges for TSH:  - - - - - - - - - -  Pregnant, first trimetser:  0.36 - 2.50 uIU/mL)  LabObservation:  14-Sep-15 13:52   OBSERVATION Reason for Test Swelling    14:43   OBSERVATION Reason for Test Swelling  Hepatic:  14-Sep-15 10:41   Bilirubin, Total 0.6  Alkaline Phosphatase 74 (46-116 NOTE: New Reference Range 08/25/13)  SGPT (ALT)  68 (14-63 NOTE: New Reference Range 08/25/13)  SGOT (AST)  38  Total Protein, Serum 6.7  Albumin, Serum  3.0  15-Sep-15 04:58   Bilirubin, Total 0.6  Alkaline Phosphatase 76 (46-116 NOTE: New Reference Range 08/25/13)  SGPT (ALT) 62 (14-63 NOTE: New Reference Range 08/25/13)  SGOT (AST) 35  Total Protein, Serum 6.4  Albumin, Serum  2.9  Cardiology:  14-Sep-15 10:28   Ventricular Rate 76   Atrial Rate 75  QRS Duration 172  QT 450  QTc 506  R Axis 155  T Axis 32  ECG interpretation *** Poor data quality, interpretation may be adversely affected Wide QRS rhythm Right bundle branch block Abnormal ECG When compared with ECG of 10-May-2013 10:50, Wide QRS rhythm has replaced Wide QRS tachycardia Vent. rate has decreased BY 113 BPM ----------unconfirmed---------- Confirmed by OVERREAD, NOT ([id number]), editor PEARSON, BARBARA ([id number]) on 10/20/2013 9:42:29 AM  Routine Chem:  14-Sep-15 10:41   Glucose, Serum  112  BUN 11  Creatinine (comp) 1.05  Sodium, Serum 139  Potassium, Serum 3.9  Chloride, Serum 102  CO2, Serum 29  Calcium (Total), Serum  8.4  Osmolality (calc) 278  eGFR (African American) >60  eGFR (Non-African American) >60 (eGFR values <48m/min/1.73 m2 may be an indication of chronic kidney disease (CKD). Calculated eGFR is useful in patients with stable renal function. The eGFR calculation will not be reliable in acutely ill patients when serum creatinine is changing rapidly. It is not useful in  patients on dialysis. The eGFR calculation may not be applicable to patients at the low and high extremes of body sizes, pregnant women, and vegetarians.)  Anion Gap 8  B-Type Natriuretic Peptide ([facility])  577 (Result(s) reported on 19 Oct 2013 at 12:42PM.)  15-Sep-15 04:58   Glucose, Serum  136  BUN 13  Creatinine (comp) 1.13  Sodium, Serum 138  Potassium, Serum  3.4  Chloride, Serum 99  CO2, Serum 30  Calcium (Total), Serum  8.3  Osmolality (calc) 278  eGFR (African American) >60  eGFR (Non-African American) >60 (eGFR values <36m/min/1.73 m2 may be an indication of chronic kidney disease (CKD). Calculated eGFR is useful in patients with stable renal function. The eGFR calculation will not be reliable in acutely ill patients when serum creatinine is changing rapidly. It is not useful in  patients on dialysis. The eGFR calculation may not be applicable to  patients at the low and high extremes of body sizes, pregnant women, and vegetarians.)  Anion Gap 9  B-Type Natriuretic Peptide ([facility])  926 (Result(s) reported on 20 Oct 2013 at 05:51AM.)  Cholesterol, Serum 138  Triglycerides, Serum 77  HDL (INHOUSE)  35  VLDL Cholesterol Calculated 15  LDL Cholesterol Calculated 88 (Result(s) reported on 20 Oct 2013 at 05:51AM.)  Cardiac:  14-Sep-15 10:41   Troponin I < 0.02 (0.00-0.05 0.05 ng/mL or less: NEGATIVE  Repeat testing in 3-6 hrs  if clinically indicated. >0.05 ng/mL: POTENTIAL  MYOCARDIAL INJURY. Repeat  testing in 3-6 hrs if  clinically indicated. NOTE: An increase or decrease  of 30% or more on serial  testing suggests a  clinically important change)  CK, Total 50 (39-308 NOTE: NEW REFERENCE RANGE  03/09/2013)  CPK-MB, Serum 2.3 (Result(s) reported on 19 Oct 2013 at 05:20PM.)    17:10   Troponin I 0.02 (0.00-0.05 0.05 ng/mL or less: NEGATIVE  Repeat testing in 3-6 hrs  if clinically indicated. >0.05 ng/mL: POTENTIAL  MYOCARDIAL INJURY. Repeat  testing in 3-6 hrs if  clinically indicated. NOTE: An increase or decrease  of 30% or more on serial  testing suggests a  clinically important change)  CK, Total 64 (39-308 NOTE: NEW REFERENCE RANGE  03/09/2013)  CPK-MB, Serum 2.4 (Result(s) reported on 19 Oct 2013 at 05:36PM.)    20:56   Troponin I 0.03 (0.00-0.05 0.05 ng/mL or less: NEGATIVE  Repeat testing in 3-6 hrs  if clinically indicated. >0.05 ng/mL: POTENTIAL  MYOCARDIAL INJURY. Repeat  testing in 3-6 hrs if  clinically indicated. NOTE: An increase or decrease  of 30% or more on serial  testing suggests a  clinically important change)  CK, Total 64 (39-308 NOTE: NEW REFERENCE RANGE  03/09/2013)  CPK-MB, Serum 2.0 (Result(s) reported on 19 Oct 2013 at 09:22PM.)  Routine UA:  14-Sep-15 10:41   Color (UA) Yellow  Clarity (UA) Hazy  Glucose (UA) Negative  Bilirubin (UA) Negative  Ketones (UA) Negative   Specific Gravity (UA) 1.013  Blood (UA) 1+  pH (UA) 5.0  Protein (UA) Negative  Nitrite (UA) Negative  Leukocyte Esterase (UA) 2+ (Result(s) reported on 19 Oct 2013 at 12:38PM.)  RBC (UA) 5 /HPF  WBC (UA) 39 /HPF  Bacteria (UA) 1+  Epithelial Cells (UA) NONE SEEN  Mucous (UA) PRESENT (Result(s) reported on 19 Oct 2013 at 12:38PM.)  Routine Coag:  14-Sep-15 10:41   Prothrombin  20.3  INR 1.8 (INR reference interval applies to patients on anticoagulant therapy. A single INR therapeutic range for coumarins is not optimal for all indications; however, the suggested range for most indications is 2.0 - 3.0. Exceptions to the INR Reference Range may include: Prosthetic heart valves, acute myocardial infarction, prevention of myocardial infarction, and combinations of aspirin and anticoagulant. The need for a higher or lower target INR must be assessed individually. Reference: The Pharmacology and Management of the Vitamin K  antagonists: the seventh ACCP Conference on Antithrombotic  and Thrombolytic Therapy. NKNLZ.7673 Sept:126 (3suppl): [id number]. A HCT value >55% may artifactually increase the PT.  In one study,  the increase was an average of 25%. Reference:  "Effect on Routine and Special Coagulation Testing Values of Citrate Anticoagulant Adjustment in Patients with High HCT Values." American Journal of Clinical Pathology 2006;126:400-405.)  Routine Hem:  14-Sep-15 10:41   WBC (CBC) 5.7  RBC (CBC)  3.68  Hemoglobin (CBC)  11.6  Hematocrit (CBC)  36.4  Platelet Count (CBC)  127 (Result(s) reported on 19 Oct 2013 at 11:12AM.)  MCV 99  MCH 31.5  MCHC  31.8  RDW 14.1  15-Sep-15 04:58   WBC (CBC) 6.0  RBC (CBC)  3.58  Hemoglobin (CBC)  11.3  Hematocrit (CBC)  35.3  Platelet Count (CBC)  120  MCV 99  MCH 31.6  MCHC 32.1  RDW 14.1  Neutrophil % 70.5  Lymphocyte % 16.2  Monocyte % 10.5  Eosinophil % 2.6  Basophil % 0.2  Neutrophil # 4.2  Lymphocyte # 1.0  Monocyte #  0.6  Eosinophil # 0.2  Basophil # 0.0 (Result(s) reported on 20 Oct 2013 at 05:26AM.)   Radiology Results: XRay:    14-Sep-15 11:22, Chest PA and Lateral  Chest PA and Lateral   REASON FOR EXAM:    weakness  COMMENTS:   May transport without cardiac monitor    PROCEDURE: DXR - DXR CHEST PA (OR AP) AND LATERAL  - Oct 19 2013 11:22AM     CLINICAL DATA:  Confusion and indigestion, lower extremity and  swelling, weakness; history of previous MI and cardiac valve  replacement an atrial fibrillation.    EXAM:  CHEST  2 VIEW    COMPARISON:  Portable chest x-ray of May 12, 2013    FINDINGS:  The lungs are borderline hypoinflated. There small bilateral pleural  effusions. The interstitial markings are coarse bilaterally. The  cardiopericardial silhouette is enlarged. The central pulmonary  vascularity is prominent. There are 6 intact sternal wires present.     IMPRESSION:  The findings are consistent with congestive heart failure with mild  interstitial edema and small bilateral pleural effusions. There has  been deterioration in the appearance of the chest since the previous  study.      Electronically Signed    By: David  Martinique    On: 10/19/2013 11:48     Verified AL:PFXTK A. Martinique, M.D., MD  [place]:    14-Sep-15 13:52, [place] Color Flow Doppler Upper Extrem Left (Arm)  [place] Color Flow Doppler Upper Extrem Left (Arm)   REASON FOR EXAM:    Swelling  COMMENTS:       PROCEDURE: [place]  - US DOPPLER UP EXTR LEFT  - Oct 19 2013  1:52PM     CLINICAL DATA:  Chronic left-sided swelling.    EXAM:  Left UPPER EXTREMITY VENOUS DOPPLER ULTRASOUND    TECHNIQUE:  Gray-scale sonography with graded compression, as well as color  Doppler and duplex ultrasound were performed to evaluate the upper  extremity deep venous system from the level of the subclavian vein  and including the jugular, axillary, basilic, radial, ulnar and  upper cephalic vein. Spectral Doppler was utilized to evaluate  flow  at rest and with distal augmentation maneuvers.    COMPARISON:  None.    FINDINGS:  Internal Jugular Vein: No evidence of thrombus. Normal  compressibility, respiratory phasicity and response to augmentation.    Subclavian Vein: No evidence of thrombus. Normal compressibility,  respiratory phasicity  and response to augmentation.    Axillary Vein: No evidence of thrombus. Normal compressibility,  respiratory phasicity and response to augmentation.  Cephalic Vein: No evidence of thrombus. Normal compressibility,  respiratory phasicity and response to augmentation.    Basilic Vein: No evidence of thrombus. Normal compressibility,  respiratory phasicity and response to augmentation.    Brachial Veins: No evidence of thrombus. Normal compressibility,  respiratory phasicity and response to augmentation.    Radial Veins: No evidence of thrombus. Normal compressibility,  respiratory phasicity and response to augmentation.    Ulnar Veins: No evidence of thrombus. Normal compressibility,  respiratory phasicity and response to augmentation.  Venous Reflux:  None visualized.    Other Findings:  None visualized.     IMPRESSION:  No evidence of deep venous thrombosis.      Electronically Signed    By: Lawrence Santiago M.D.    On: 10/19/2013 14:55         Verified By: Resa Miner. MATTERN, M.D.,    14-Sep-15 14:43, [place] Color Flow Doppler Low Extrem Left (Leg)  [place] Color Flow Doppler Low Extrem Left (Leg)   REASON FOR EXAM:    Swelling  COMMENTS:       PROCEDURE: [place]  - US DOPPLER LOW EXTR LEFT  - Oct 19 2013  2:43PM     CLINICAL DATA:  Chronic left swelling    EXAM:  LEFT LOWER EXTREMITY VENOUS DOPPLER ULTRASOUND    TECHNIQUE:  Gray-scale sonography with graded compression, as well as color  Doppler and duplex ultrasound were performed to evaluate the lower  extremity deep venous systems from the level of the common femoral  vein and including the common femoral,  femoral, profunda femoral,  popliteal and calf veins including the posterior tibial, peroneal  and gastrocnemius veins when visible. The superficial great  saphenous vein was also interrogated. Spectral Doppler was utilized  to evaluate flow at rest and with distal augmentation maneuvers in  the common femoral, femoral and popliteal veins.    COMPARISON:  None.    FINDINGS:  Common Femoral Vein: No evidence of thrombus. Normal  compressibility, respiratory phasicity and response to augmentation.    Saphenofemoral Junction: No evidence of thrombus. Normal  compressibility and flow on color Doppler imaging.  Profunda Femoral Vein: No evidence of thrombus. Normal  compressibility and flow on color Doppler imaging.    Femoral Vein: No evidence of thrombus. Normal compressibility,  respiratory phasicity and response to augmentation.    Popliteal Vein: No evidence of thrombus. Normal compressibility,  respiratory phasicity and response to augmentation.    Calf Veins: No evidence of thrombus. Normal compressibility and flow  on color Doppler imaging.    Superficial Great Saphenous Vein: No evidence of thrombus. Normal  compressibility and flow on color Doppler imaging.  Venous Reflux:  None.    Other Findings:  None.     IMPRESSION:  No evidence of deep venous thrombosis.      Electronically Signed    By: Daryll Brod M.D.    On: 10/19/2013 15:15         Verified By: Earl Gala, M.D.,  Cardiology:    14-Sep-15 10:28, ED ECG  Ventricular Rate 76  Atrial Rate 75  QRS Duration 172  QT 450  QTc 506  R Axis 155  T Axis 32  ECG interpretation   *** Poor data quality, interpretation may be adversely affected  Wide QRS rhythm  Right bundle branch block  Abnormal ECG  When compared with ECG of 10-May-2013 10:50,  Wide QRS rhythm has replaced Wide QRS tachycardia  Vent. rate has decreased BY 113 BPM  ----------unconfirmed----------  Confirmed by OVERREAD, NOT  ([id number]), editor PEARSON, BARBARA ([id number]) on 10/20/2013 9:42:29 AM  ED ECG    Assessment/Plan:  Invasive Device Daily Assessment of Necessity:  Does the patient currently have any of the following indwelling devices? foley   Indwelling Urinary Catheter continued, requirement due to   Reason to continue Indwelling Urinary Catheter immobilization due to sedation/paralysis  urinary incontinence, poor cooperation AND strict Intake/Output required   Assessment/Plan:  Assessment IMP  edema   congestive heart  failure  cardiomyopathy  confusion/ and delirium  COPD  mechanical mitral valve  atrial fibrillation  anticoagulation/ coagulopathy  hypertension .   Plan PLAN  continue Coumadin for anticoagulation for AFib and mitral valve  diuretics for heart failure  supplemental oxygen as necessary for hypoxemia and shortness of breath  DVT prophylaxis  support stockings for edema  diltiazem for rate control for AFib  continue hypertension control  inhalers for COPD  consider Psychiatry for  confusion  follow-up electrolytes  conservative cardiac care   Electronic Signatures: Yolonda Kida (MD)  (Signed 15-Sep-15 17:22)  Authored: Chief Complaint, VITAL SIGNS/ANCILLARY NOTES, Brief Assessment, Lab Results, Radiology Results, Assessment/Plan   Last Updated: 15-Sep-15 17:22 by Yolonda Kida (MD)

## 2014-05-29 NOTE — Consult Note (Signed)
Brief Consult Note: Diagnosis: delirium due to medical illness.   Patient was seen by consultant.   Consult note dictated.   Comments: Psychiatry: Patient seen and chart reviewed. Patient with new and rapid onset delirium. No evidence of stroke. Curently calm. No need to change acute orders. Will follow as needed.  Electronic Signatures: Audery Amellapacs, John T (MD)  (Signed 11-Feb-15 22:22)  Authored: Brief Consult Note   Last Updated: 11-Feb-15 22:22 by Audery Amellapacs, John T (MD)

## 2014-05-29 NOTE — Discharge Summary (Signed)
PATIENT NAME:  James [name], James [name] MR#:  [id number] DATE OF BIRTH:  [birth year]  DATE OF ADMISSION:  03/16/2013 DATE OF DISCHARGE: 03/23/2013.   ADMITTING DIAGNOSIS: Altered mental status.   DISCHARGE DIAGNOSES: 1. Acute encephalopathy, possibly related to a cerebrovascular accident, although two CT scans are negative.  2. Ventricular tachycardia versus apparent atrial fibrillation, seen by cardiology. On Sotalol and is chronically anticoagulated.  3. Electrolyte imbalances, status post replacement.  4. Chronic atrial fibrillation.  5. Diabetes.  6. Aortic valve replacement mechanical on chronic anticoagulation.   CON[facility]: [facility].   PERTINENT LABS AND EVALUATIONS: Admitting glucose 167, BUN 12, creatinine 0.78, sodium 131, potassium 4.1, chloride 97, CO2 is 28, magnesium 1.5, calcium 9.3. Troponin 0.02 0.04 and 0.04. WBC 9.2, hemoglobin 14.6, platelet count was 134. INR 2.   Urine cultures, mixed bacterial organisms. Blood cultures no growth. Urinalysis showed nitrites negative, leukocytes trace. EKG showed atrial flutter, right bundle branch block CT scan of the head done one on the 9th showed no focal acute abnormality, chronic small vessel changes. Diffuse atrophy. Repeat CT scan on the 11th showed no acute abnormality, mild, moderate atrophy, small lacunar infarct.   HOSPITAL COURSE: Please refer to H and P done by the admitting physician. The patient is a 81 year old white male with history of chronic atrial fibrillation, who was treated with digoxin and anticoagulated with warfarin presented to the ER after he became increasingly confused and weak. The ER he was minimally responsive noted to have atrial fibrillation and then he had an episode of wide complex tachycardia felt to be due to VT. The patient did have pulses throughout that presentation. He was admitted for acute encephalopathy and the tachycardia. He was seen by cardiology and continued to be monitored on telemetry. The  patient was thought to have a possible V. tach versus atrial fibrillation with aberrancy. He has not had any further wide complex tachycardia. He is maintained on anticoagulation, which he is chronically on. The patient, at this point is doing well otherwise in terms of the heart. In terms of his acute encephalopathy, the cause is unclear. He had a CT scan of the head done twice, it showed no acute cerebrovascular accident, but there is a possibility, that is with his wide complex tachycardia he could of have had episode of hypotension causing some transient changes in mental status. He could also have had a possible cerebrovascular accident with his INR being subtherapeutic on presentation. There was no evidence of infection. His mental status has improved significantly. Now he is close to baseline. The patient is very weak and has a generalized weakness, was seen by PT and recommended for rehab, which is currently arranged.   DISCHARGE MEDICATIONS: At this time: Metformin 500 mg 1 tab p.o. b.i.d., tramadol 50 every six hours as needed for pain, Coumadin 3 tablets daily, sotalol 80 mg 1 tab p.o. b.i.d., Tylenol 650 q.4 p.r.n., magnesium hydroxide 30 mL every 12 hours as needed for constipation, sliding scale insulin as written, olanzapine 5 mg IM 2 times a day as needed for agitation, amlodipine 2.5 daily, Protonix 40 daily.   DIET: Low-sodium, carbohydrate -controlled diet.   ACTIVITY: As tolerated.   PT evaluation and treatment. [facility] in 2 to 4 weeks. The patient is to have INR check in four days at the skilled nursing facility. Coumadin needs to be adjusted for INR of 2.5 to 3.5.   TIME SPENT:  35 minutes on the discharge.    ____________________________  Darrelle Wiberg H. Allena Katz, MD shp:sg D: 03/23/2013 10:32:08 ET T: 03/23/2013 11:03:01 ET JOB#: [id number]  cc: Marketia Stallsmith H. Allena Katz, MD, <Dictator> Charise Carwin MD ELECTRONICALLY SIGNED 03/25/2013 14:19

## 2014-05-29 NOTE — Consult Note (Signed)
PATIENT NAME:  James [name], James [name] MR#:  [id number] DATE OF BIRTH:  14-May-1933  DATE OF CONSULTATION:  05/13/2013  REFERRING PHYSICIAN:   CONSULTING PHYSICIAN:  James [name], [name]  REASON FOR CONSULTATION: Mr. James [name] is a 81 year old male recently admitted. He has a history of diabetes mellitus with ulceration managed outpatient by James [name]. He relates a sore on his right third toe.   PAST MEDICAL HISTORY: COPD, mechanical heart valve with a St. Jude heart valve on Coumadin, chronic atrial fibrillation, diabetes mellitus and hypertension.   PAST SURGICAL HISTORY: Mechanical heart valve placement and hammertoe repairs, left second and third.   MEDICATIONS: List reviewed in the chart.   ALLERGIES: SULFA.   FAMILY HISTORY: Hypertension.   SOCIAL HISTORY: H lives at home with his wife. History of tobacco use, but quit about 30 years ago. Denies alcohol.   REVIEW OF SYSTEMS: Does have significant numbness and neuropathy in the feet. Denies any specific drainage from the sore on his third toe. No history of injury. He denies any fever or chills. Denies nausea or vomiting.   PHYSICAL EXAMINATION:  VASCULAR: DP and PT pulses are palpable. Capillary filling time within normal limits.  NEUROLOGICAL: There is some numbness with loss of protective threshold distally in the toes. Proprioception impaired.  INTEGUMENT: Skin is warm, dry, and atrophic with diminished hair growth. Some bilateral hyperpigmentations noted. Some edema, more so in the left foot than in the right foot. An eschar over the dorsal aspect of the right third toe as well and a hemorrhagic lesion distally at the tip of the third toe. Upon debridement, there is a full thickness underlying ulceration. Both ulcers measure approximately 8 to 10 mm in diameter. No significant erythema or cellulitis.  MUSCULOSKELETAL: Stiff range of motion in the pedal joints. Some chronic hammertoe contractures corresponding to the patient's lesions.    ASSESSMENT:  1.  Diabetes with some degree of neuropathy.  2.  Ulceration x 2, right third toe.   PLAN: Excisional debridement sharply using tissue nippers of both of the ulcerations on the right third toe was performed, partial thickness and distally in full thickness dorsally. Orders for Mupirocin ointment and a light bandage daily. No sign of infection and the patient is stable to follow up with James [name] outpatient on discharge.  ____________________________ James [name], [name] tc:aw D: 05/13/2013 12:24:59 ET T: 05/13/2013 12:36:22 ET JOB#: [id number]  cc: James [name], [name], <Dictator> James [name] ELECTRONICALLY SIGNED 05/26/2013 11:35

## 2014-05-29 NOTE — H&P (Signed)
PATIENT NAME:  James [name], FOOTS MR#:  [id number] DATE OF BIRTH:  [birth year]  DATE OF ADMISSION:  05/07/2013  PRIMARY CARE PHYSICIAN: Dr. Maudie Flakes  REFERRING ER PHYSICIAN: Dr. Scotty Court  CHIEF COMPLAINT: Chest pain.   HISTORY OF PRESENT ILLNESS: The patient is a 81 year old Caucasian male with past medical history of chronic atrial fibrillation, on Coumadin, INR is at 2.9 today, hypertension, diabetes mellitus, COPD, and history of heart valve repair with St. Jude valve, is presenting to the ER with the chief complaint of chest pain. The patient is reporting that his chest pain started at around 7:30 p.m. last night. It is pressure like, associated with diaphoresis and shortness of breath. He denies any nausea or dizziness. He felt cold and clammy at the time. The patient had trouble falling asleep, but eventually he woke up with chest pressure with no radiation. The chest pressure was not going away and it was persistent. Prior to this episode, the patient used to have chest pain and pressure which used to go away if he lies down, but this time the chest pressure was not going away and EMS was called. They have recommended to the patient to take aspirin 325 mg. The patient took aspirin and he started feeling better. By the time EMS arrived to take the patient to the ER, the chest pain was almost gone. The patient denies any dizziness or loss of consciousness. The patient is brought into the ER. EKG revealed atrial fibrillation with RVR.  The patient denies any palpitations or dizziness. He takes Coumadin on a regular basis regarding chronic atrial fibrillation and mechanical heart valve and INR is at 2.9. The patient was just recently admitted to the hospital in February 2015 and at that time his serum creatinine and troponins were normal.  The ER physician, Dr. Scotty Court, has discussed with cardiologist on call, Dr. Gwen Pounds, regarding the positive troponin at 6.7. Dr. Gwen Pounds has recommended to start  the patient on heparin drip. The patient was given heparin bolus and heparin nomogram was initiated. During my examination, the patient denies any chest pressure or shortness of breath. Wife is at bedside. Denies any heart attacks in the past.   PAST MEDICAL HISTORY: Chronic obstructive pulmonary disease, mechanical heart valve with St. Jude heart valve on Coumadin, chronic atrial fibrillation on Coumadin, diabetes mellitus, COPD, hypertension.   PAST SURGICAL HISTORY: Mechanical heart valve placement.  ALLERGIES: SULFA.   PSYCHOSOCIAL HISTORY: Lives at home with spouse. He used to smoke but quit smoking approximately 30 years ago. Denies alcohol or illicit drug usage.   FAMILY HISTORY: Hypertension runs in his family.   HOME MEDICATIONS:  Coumadin 1 mg 3 tablets p.o. once daily, tramadol 50 mg 1 tablet p.o. q. 6 hours, sotalol 80 mg p.o. b.i.d., pantoprazole 40 mg p.o. once daily, olanzapine 5 mg intramuscularly 2 times a day as needed for agitation, metformin 1 tablet p.o. b.i.d., magnesium hydroxide 30 mL p.o. q. 12 hours, amlodipine 2.5 mg 1 tablet p.o. once daily, Tylenol 325 mg 1 tablet p.o. q. 4 hours as needed basis.  REVIEW OF SYSTEMS:  CONSTITUTIONAL: Denies any fever or fatigue. Complaining of weakness.  EYES: Denies blurry vision, double vision, glaucoma.  ENT: Denies epistaxis or discharge, ear pain, hearing loss.  RESPIRATORY: Denies cough. Has history of COPD.  CARDIOVASCULAR: The patient had midsternal chest pressure at 6 to 7:30 p.m., which subsequently resolved after taking aspirin. Denies any palpitations, syncope.  GASTROINTESTINAL: Denies nausea, vomiting, diarrhea, abdominal pain.  GENITOURINARY: No  dysuria or hematuria.  ENDOCRINE: Denies polyuria, nocturia, thyroid problems.  HEMATOLOGIC AND LYMPHATIC: No anemia, easy bruising, bleeding.  INTEGUMENTARY: No acne, rash, lesions.  MUSCULOSKELETAL: No neck pain or back pain. Denies gout.  NEUROLOGIC: Denies vertigo,  ataxia.  PSYCHIATRIC: No ADD, OCD.   PHYSICAL EXAMINATION: VITAL SIGNS: Temperature 97.6, pulse 100, respirations 18, blood pressure 133/71, pulse ox 98% on 2 liters.  GENERAL APPEARANCE: Not under acute distress. Moderately built and nourished.  HEENT: Normocephalic, atraumatic. Pupils are equally reacting to light and accommodation. No scleral icterus. No conjunctival injection. No sinus tenderness. No postnasal drip. Moist mucous membranes.  NECK: Supple. No JVD. No thyromegaly.  LUNGS: Clear to auscultation bilaterally. No accessory muscle usage. No anterior chest wall tenderness on palpation.  HEART: Irregularly irregular. Positive click and murmur probably from mechanical heart valve.  GASTROINTESTINAL: Soft. Bowel sounds are positive in all 4 quadrants. Nontender, nondistended. No hepatosplenomegaly. No masses or hepatosplenomegaly.  NEUROLOGIC: Awake, alert and oriented x3. Cranial nerves II through XII are intact. Motor and sensory are grossly intact. Reflexes are 2+.  EXTREMITIES: Trace edema. No cyanosis. No clubbing.  SKIN: Warm to touch. Normal turgor. No rashes. No lesions.  MUSCULOSKELETAL: No joint effusion, tenderness, or edema.  PSYCHIATRIC: Normal mood and affect.   LABORATORY AND IMAGING STUDIES: Glucose 175, BUN 21, creatinine 1.77. Sodium, potassium chloride and CO2 are normal. GFR 36. Anion gap is at 9. Serum osmolality 288. Calcium 8.5. Troponin 6.70. CBC: WBC 10.3, hemoglobin 13.7, hematocrit 42.9, platelets are 101. PT 29.5. INR 2.9. LFTs: Total protein 7, albumin 3.4, bili total 1.6, alkaline phosphatase 66, AST and ALT are elevated at 142 and 102.   Portable chest x-ray, single view: No acute cardiopulmonary process seen.   A 12-lead EKG: A-fib with RVR at 133, right bundle branch block.   ASSESSMENT AND PLAN: A 81 year old Caucasian male brought into the ER with chest pressure that started at 7:30 p.m. Subsequently resolved after taking aspirin, elevated troponin  at 6.7, and initial EKG has revealed atrial fibrillation with rapid ventricular response at 133. Will admit with the following assessment and plan.  1.  Non-ST-segment elevation myocardial infarction, probably from demand ischemia from atrial fibrillation with rapid ventricular response. Will admit him to CCU. The patient will be monitored on telemetry. Provide him IV fluids. Cardiology consult is placed  and discussed with Dr. Darrold JunkerParaschos who is on call. He is aware. The patient is started on heparin bolus. We will continue that. Cycle cardiac biomarkers. The patient will be on oxygen, nitroglycerin, aspirin, beta blocker and statin. The patient is on Coumadin and INR is at 2.8. 2.  Acute on chronic atrial fibrillation with rapid ventricular response. History of mechanical heart valve with St. Jude heart valve, on Coumadin. INR is therapeutic at 2.9.  3.  History of diabetes mellitus. The patient will be on sliding scale insulin. Currently the patient is n.p.o.  4.  Hypertension. The patient is n.p.o. except for meds. We will continue his home medication, Sotalol.  Will provide him IV Lopressor p.r.n.  5.  Chronic obstructive pulmonary disease. Not in exacerbation. Will provide neb treatments on as-needed basis for shortness of breath and wheezing.   He is DNR. His wife is the medical power of attorney.   We will provide gastrointestinal prophylaxis with Protonix and deep vein thrombosis prophylaxis is not needed as INR is therapeutic and the patient is on heparin drip as well.   Plan of care discussed with the patient and his  wife at bedside. He is DNR. Wife is medical power of attorney.   TOTAL CRITICAL CARE TIME SPENT: 50 minutes.   ____________________________ Ramonita Lab, MD ag:sb D: 05/07/2013 07:29:04 ET T: 05/07/2013 07:53:55 ET JOB#: [id number]  cc: Ramonita Lab, MD, <Dictator> Ramonita Lab MD ELECTRONICALLY SIGNED 05/28/2013 7:08

## 2014-05-29 NOTE — Discharge Summary (Signed)
PATIENT NAME:  James [name], [name] MR#:  [id number] DATE OF BIRTH:  [birth year]  DATE OF ADMISSION:  10/19/2013 DATE OF DISCHARGE:  10/21/2013  For a detailed note, please see the history and physical done by Dr. Ramonita LabAruna Gouru.  DISCHARGE DIAGNOSES: 1.  Congestive heart failure, likely acute on chronic diastolic dysfunction. 2.  History of chronic atrial fibrillation, status post St. Jude's valve, aortic. 3.  Urinary tract infection.  4.  Benign prostatic hypertrophy.   DIET: The patient is being discharged on a low-sodium diet.   ACTIVITY: As tolerated.   FOLLOWUP: With Dr. Maryjane HurterFeldpausch in the next 1-2 weeks.   DISCHARGE MEDICATIONS: Cardizem CD 300 mg daily, aspirin 81 mg daily, amiodarone 400 mg daily, tramadol 50 mg q. 6 hours as needed, warfarin 2 mg daily, Flomax 0.4 mg daily, Lasix 40 mg daily, potassium 20 mEq daily, lisinopril 2.5 mg daily, Ceftin 250 mg b.i.d. x 5 days.   CONSULTANTS DURING THE HOSPITAL COURSE: Dr. Juliann Paresallwood from cardiology.   PERTINENT STUDIES DONE DURING THE HOSPITAL COURSE: A chest x-ray done on admission showing findings consistent with congestive heart failure, mild interstitial edema with small bilateral effusions. An ultrasound of the upper extremities showing no evidence of any DVT. Also, ultrasound of the lower extremities on the left side showing no evidence of an acute DVT.   HOSPITAL COURSE: This is a 81 year old male with medical problems as mentioned above, presented to the hospital with shortness of breath and worsening lower extremity edema and noted to be in CHF.   1.  CHF. This was likely the cause of the patient's shortness of breath and worsening lower extremity edema. This was likely acute on chronic diastolic CHF. The patient was diuresed with IV Lasix, responded well to it, is about 10 liters negative since admission, has lost about 6 to 8 pounds. He clinically feels much better. He is therefore being discharged on oral Lasix along with continuation of  his Cardizem. He was not on an ACE inhibitor, which I am currently initiating. The patient will also followup with the [facility] as an outpatient.  2.  History of chronic atrial fibrillation. The patient remained rate controlled. We will continue his Cardizem and amiodarone. He is on Coumadin. His INR is subtherapeutic presently.  3.  History of St. Jude aortic valve. The patient is on Coumadin. He will continue his goal INR as 2.5. His INR on date of discharge was 1.8.  4.  BPH. The patient was maintained on his Flomax. He will resume that. 5.  Urinary tract infection. This was based on an urinalysis. The patient was treated with IV ceftriaxone in the hospital. Urine cultures are growing gram-negative rod, but they have not been identified yet. He is currently afebrile and hemodynamically stable. At this point, he will be discharged on oral Ceftin.   Home health physical therapy and nursing services were offered to the patient, but he did not want them as he refused them therefore he is being discharged home.   TIME SPENT ON DISCHARGE: Forty minutes.    ____________________________ Rolly PancakeVivek J. Cherlynn KaiserSainani, MD vjs:TT D: 10/21/2013 15:40:35 ET T: 10/21/2013 16:49:41 ET JOB#: [id number]  cc: Rolly PancakeVivek J. Cherlynn KaiserSainani, MD, <Dictator>  Houston SirenVIVEK J SAINANI MD ELECTRONICALLY SIGNED 10/26/2013 9:47

## 2014-05-29 NOTE — Consult Note (Signed)
PATIENT NAME:  James [name], James [name] MR#:  [id number] DATE OF BIRTH:  [birth year]  DATE OF CONSULTATION:  05/08/2013  REFERRING PHYSICIAN:   CONSULTING PHYSICIAN:  Audery AmelJohn T. Sivan Cuello, MD  IDENTIFYING INFORMATION AND REASON FOR CONSULT: A 81 year old man currently in the hospital because of chest pain on the critical care unit. Consultation because he had made statements implying something depressive.   HISTORY OF PRESENT ILLNESS: Information obtained from the patient, the patient's wife, and the chart. Evidently, the patient had made some statements about dying in the presence of care providers in the critical care unit raising the concern about depression. The patient was cooperative with the interview. He states that overall his mood has been okay. He says that he is realistic about his health problems and knows that they are getting worse and knows that with all of his health problems, his lifespan may be limited but he denies that his mood has been particularly bad. He states that he has a good attitude tries to enjoy his life as best he can. He adamantly denies any suicidal ideation and says that he is by nature not someone who gives up. He insists that he continues to enjoy things in life and he specifically cites watching sports on television being with his wife. He denies any ongoing sleep or appetite problems. Denies any psychotic symptoms. The patient states that he feels that he has a realistic attitude towards his health problems and understands that he may be nearing the end of his life, but says that he was just discussing this with his wife and providers and in no way meant to imply that he was having any thoughts about trying to kill himself and insists that he is quite agreeable to appropriate medical treatment.   PAST PSYCHIATRIC HISTORY: I saw this gentleman in February during an episode of delirium, but otherwise he has no past psychiatric history. No history of depression or treatment for  depression. No history of suicidal behavior. No history of psychiatric hospitalization. No real history that he would describe of a depression or treatable mood problem,   SOCIAL HISTORY: Retired. Worked multiple jobs in the past. Lives with his wife of approximately 30 years. They live at home alone. They both have adult children from other marriages. The patient says that he very much enjoys his time with his wife.   PAST MEDICAL HISTORY: The patient has COPD, high blood pressure, diabetes, and has had a heart valve replacement, has had episodes of atrial fibrillation and is on chronic anticoagulation.   SUBSTANCE ABUSE HISTORY: Denies that he drinks or abuses any drugs.   REVIEW OF SYSTEMS: Denies depressed mood. Denies suicidal ideation. Denies hallucinations. Does feel fatigued but says that that has getting better. Denies any panic attacks. Does have a little discomfort, but the rest of the review of systems is generally negative.   MEDICATIONS: Nitroglycerin ointment, pantoprazole, simvastatin, sotalol, aspirin, insulin.   ALLERGIES: SULFA DRUGS.   MENTAL STATUS EXAMINATION: The patient is awake and alert in a hospital bed. Presentation is appropriate given his condition. Makes good eye contact throughout the interview. Speech is normal in rate, tone, and volume. Affect is actually fairly expansive and pleasant and upbeat given how sick he has been. Mood is stated as being fine. Thoughts are lucid without any loosening of associations or delusions. Denies auditory or visual hallucinations. Denies suicidal or homicidal ideation. Alert and oriented x 4. Short-and long-term memory intact reasonable judgment and insight.   ASSESSMENT: A 81 year old  gentleman to my interview today gives a convincing presentation of a man who is not depressed. His wife backs all of this up. I think that he probably has a slightly atypical manner about him being a little more straightforward in his discussion of  mortality issues and a little bit more talkative than perhaps other people in his condition would be, which may have been off putting or misinterpreted. I do not think that he is at risk of suicide right now. I do not see any sign of any need for any kind of psychiatric treatment.   TREATMENT PLAN: No indication for medication. I can check up on him as he continues to be in the hospital if necessary, but I do not think it is likely that he needs any mental health treatment.   DIAGNOSIS, PRINCIPAL AND PRIMARY:   AXIS I: Adjustment disorder.   SECONDARY DIAGNOSES:  AXIS I: No further.   AXIS II: No diagnosis.   AXIS III: Multiple medical problems, atrial fibrillation, heart valve replacement, anticoagulation, high blood pressure, diabetes.   AXIS IV: Moderate from illnesses.   AXIS V: Functioning at time of evaluation 62.    ____________________________ Audery Amel, MD jtc:lt D: 05/09/2013 00:56:02 ET T: 05/09/2013 01:08:35 ET JOB#: [id number]  cc: Audery Amel, MD, <Dictator> Audery Amel MD ELECTRONICALLY SIGNED 05/13/2013 23:45

## 2014-05-29 NOTE — Consult Note (Signed)
PATIENT NAME:  James [name], FEILD MR#:  [id number] DATE OF BIRTH:  10-Sep-1933  DATE OF CONSULTATION:  03/18/2013  CONSULTING PHYSICIAN:  Audery Amel, MD  IDENTIFYING INFORMATION AND REASON FOR CONSULT: A 81 year old man currently in the hospital for acute mental status change. Consultation for assistance with mental status change.   HISTORY OF PRESENT ILLNESS: Information obtained from the chart and the patient's wife, less so from the patient himself. He presented to the hospital on the 9th. Wife reports that he had been complaining of bad headaches for a couple of weeks.   On the day of admission, he appeared to be more tired than usual and more lethargic. He slumped down near the bed and became at least partially unresponsive, at which time she had him brought into the hospital.   Since being in the hospital, the patient has been only partially responsive. The wife tells me that his presentation today was a little bit more alert and interactive than it had been the day before. She denies that there had been any symptoms of depression or reason to suspect he had had any mental health conditions. No recent evidence of psychosis. No particular emotional stress going on. No clear change in medication recently. No substance abuse.   PAST PSYCHIATRIC HISTORY: The patient's wife denies that he has had any history of mental health problems ever before in his past, never been treated for depression or mental health  conditions. She does report that of late in recent years, he has had more memory problems, particularly short-term memory, but evidently it had not become severely disabling.   PAST MEDICAL HISTORY: The patient has a history of chronic atrial fibrillation and is on Coumadin, has diabetes, history of a heart valve replacement, COPD, hypertension.   SOCIAL HISTORY: Lives at home with his wife. Wife states there had not been any particular emotional stress or trauma recently. Does not smoke. Does  not abuse alcohol or drugs.   FAMILY HISTORY: No known family history of mental health problems.   REVIEW OF SYSTEMS: The patient is only minimally able to cooperate. He indicates that he is not having any specific pain. He is not able, however, to answer any other questions well. Wife states that he appears to be calmer today. He had been a little bit more interactive earlier in the day. She does not report any specific complaints that he had had.   MENTAL STATUS EXAMINATION: A critically ill gentleman in the critical care unit. Eye contact minimal. Eyes stay closed most of the time. Psychomotor activity nearly nonexistent. He speaks only a couple of words. He is barely able to state his name, nothing else intelligible that I can find. Affect is blank. At times he looks somewhat pained and grimaces. Unable to state anything about mood. Does not appear to be responding to internal stimuli. No evidence of wish of self-harm but unable to answer specific questions about it. Cognitively currently quite impaired, unable to answer any questions about short- or long-term memory or orientation.   ASSESSMENT: This is a 81 year old man with rapid-onset delirium. No history of mental health problems. Nothing about this history would suggest to me that this is likely to be a "psychiatric" condition as opposed to a delirium from medical illness.   I understand that 2 CAT scans have been done so far and there is no evidence that has shown up  of a stroke. Right now, it seems like it is not completely clear what might  be causing his condition. Doses of IM Geodon have been used to assist with agitation and he also has orders for other p.r.n. antipsychotics and benzodiazepines.   I do not have any suggestion about changing any of his medicine orders right now. It does not seem like there has been a clear pattern that has developed that would indicate the need for standing antipsychotics for benzodiazepines. These can be  used p.r.n. per the usual routine of the critical care unit for controlling any agitation that would threaten harm to the patient.   I will follow up as needed. No other psychiatric recommendation at this point.   DIAGNOSIS, PRINCIPAL AND PRIMARY:  AXIS I: Delirium due to medical condition, probably multifactorial.   SECONDARY DIAGNOSES:  AXIS I: No further.  AXIS II: No diagnosis.  AXIS III: Medical illnesses as noted above, history of atrial fibrillation, heart valve replacement, anticoagulation.  AXIS IV: Moderate just from the acute illness.  AXIS V: Functioning at time of evaluation is 30.   ____________________________ Audery AmelJohn T. Jenaveve Fenstermaker, MD jtc:np D: 03/18/2013 22:29:38 ET T: 03/18/2013 22:55:01 ET JOB#: [id number]  cc: Audery AmelJohn T. Verdella Laidlaw, MD, <Dictator> Audery AmelJOHN T Ayva Veilleux MD ELECTRONICALLY SIGNED 03/19/2013 13:06

## 2014-05-29 NOTE — Consult Note (Signed)
   Present Illness 81 yo male with history of chronic afib treated with sotolol and digoxin and anticoagulated with warfarin. He presented to the er after his wife called 911 after he became increasingly confused and weak. In the er, he was minimally responsive. He was noted to be in afib with good rate control. Per report, pt had a run of wide complex tachycardia felt to be vt. Pt was apparently conscious and had a pulse throughout. He was given amiodarone with conversion back to afib. Strips from event are not currently available. This am pt is minimally responsive. He is in afib with controlled vr. He is felt to have urosepsis. His inr was therapeutic at 2.0. He is hemodynamically stable. Head ct revealed diffuse atrophy with no acute findings. Mg is 1.8. K is greater than 4.Pt is s/p avr in 1996. EF was normal at 55-60% by echo in 2011. Has ruled out for an mi thus far.   Physical Exam:  GEN critically ill appearing   NECK No masses   RESP normal resp effort   CARD Irregular rate and rhythm  Murmur   Murmur Systolic   Systolic Murmur Out flow   ABD normal BS   SKIN normal to palpation   PSYCH lethargic   Review of Systems:  Subjective/Chief Complaint increasingly confused and lethargic   General: Fatigue  Weakness   ROS Pt not able to provide ROS   Medications/Allergies Reviewed Medications/Allergies reviewed   EKG:  Abnormal RBBB   Interpretation afib with variable ventricular response    Sulfa drugs: Unknown   Impression 81 yo male with history of chronic afib  and s/p avr who presented to the er after being noted to be confused and weak at home by is wife. In the er, he was noted to be normotensive and in afib with variable vr. He reportedly had a wide complex tachcyardia which was hemodynamically stable. He received a bolus of amiodarone with return to afib with variable response. Strips are not available for review at present. Etiology is vt vs abearant afib. Etiology  of confusion is unclear. Felt to be sepsis at present. Pt is currently hemodynamically stable and receiving emperic antibiotics. Pan cultures are pending.Pt not able to give history or safely take po.   Plan 1. Continue with iv amiodarone for now following rate and QTc. Will convert to po when able to take po 2. Continue with warfarin with an inr goal of 2.0-3.0 3. Continue emperic antibiotics following cultures 4. Follow neuro status. 5. Will follow with you   Electronic Signatures: Dalia HeadingFath, Joleah Kosak A (MD)  (Signed 10-Feb-15 08:02)  Authored: General Aspect/Present Illness, History and Physical Exam, Review of System, Home Medications, EKG , Allergies, Impression/Plan   Last Updated: 10-Feb-15 08:02 by Dalia HeadingFath, Diallo Ponder A (MD)

## 2014-06-10 ENCOUNTER — Encounter: Payer: Medicare Other | Attending: Surgery | Admitting: Surgery

## 2014-06-10 DIAGNOSIS — L89323 Pressure ulcer of left buttock, stage 3: Secondary | ICD-10-CM | POA: Diagnosis present

## 2014-06-10 DIAGNOSIS — J45909 Unspecified asthma, uncomplicated: Secondary | ICD-10-CM | POA: Diagnosis not present

## 2014-06-10 DIAGNOSIS — I509 Heart failure, unspecified: Secondary | ICD-10-CM | POA: Insufficient documentation

## 2014-06-10 DIAGNOSIS — L89313 Pressure ulcer of right buttock, stage 3: Secondary | ICD-10-CM | POA: Insufficient documentation

## 2014-06-10 DIAGNOSIS — I10 Essential (primary) hypertension: Secondary | ICD-10-CM | POA: Insufficient documentation

## 2014-06-10 DIAGNOSIS — I2510 Atherosclerotic heart disease of native coronary artery without angina pectoris: Secondary | ICD-10-CM | POA: Diagnosis not present

## 2014-06-10 DIAGNOSIS — Z87891 Personal history of nicotine dependence: Secondary | ICD-10-CM | POA: Diagnosis not present

## 2014-06-10 DIAGNOSIS — E11622 Type 2 diabetes mellitus with other skin ulcer: Secondary | ICD-10-CM | POA: Insufficient documentation

## 2014-06-10 DIAGNOSIS — J449 Chronic obstructive pulmonary disease, unspecified: Secondary | ICD-10-CM | POA: Insufficient documentation

## 2014-06-10 DIAGNOSIS — I4891 Unspecified atrial fibrillation: Secondary | ICD-10-CM | POA: Diagnosis not present

## 2014-06-11 NOTE — Progress Notes (Addendum)
IVIN, ROSENBLOOM ([id number]) Visit Report for 06/10/2014 Chief Complaint Document Details Patient Name: James [name], James [name] Date of Service: 06/10/2014 2:30 PM Medical Record Number: [id number] Patient Account Number: [id number] Date of Birth/Sex: [birth year] (81 y.o. Male) Treating RN: Clover Mealy, RN, BSN, [place] Sink Primary Care Physician: Maudie Flakes Other Clinician: Referring Physician: Treating Physician/Extender: Rudene Re in Treatment: 0 Information Obtained from: Patient Chief Complaint Patient presents to the wound care center for a consult due non healing wound. this 81 year old gentleman comes with bilateral gluteal ulcerations which he's had for about a month. Electronic Signature(s) Signed: 06/10/2014 4:59:19 PM By: Evlyn Kanner MD, FACS Entered By: Evlyn Kanner on 06/10/2014 15:56:31 James [name] ([id number]) -------------------------------------------------------------------------------- HPI Details Patient Name: James [name] Date of Service: 06/10/2014 2:30 PM Medical Record Number: [id number] Patient Account Number: [id number] Date of Birth/Sex: 17-Sep-1933 (81 y.o. Male) Treating RN: Clover Mealy, RN, BSN, [place] Sink Primary Care Physician: Maudie Flakes Other Clinician: Referring Physician: Treating Physician/Extender: Rudene Re in Treatment: 0 History of Present Illness Location: Patient presents with an ulcer on the right and left buttock Quality: Patient reports experiencing a dull pain to affected area(s). Severity: Patient states wound are getting worse. Duration: Patient has had the wound for > 3 months prior to seeking treatment at the wound center Timing: Pain in wound is Intermittent (comes and goes Context: The wound occurred when the patient had a fall and injured himself on his side but he was on his bottom and dragged himself for a while until he could get to a phone. Modifying Factors: he sits in a chair for about 18 hours a day and sleeps about 6  hours. Associated Signs and Symptoms: Patient reports having difficulty standing for long periods. HPI Description: very pleasant 81 year old gentleman whose had chronic morbidities including asthma, diabetes mellitus type 2, chronic disease, COPD, hypertension, atrial fibrillation, SVT, CAD, cardiomyopathy, congestive heart failure. About a month ago he had a fall and during that time he had to drag himself on his bottom for a bit until he could get to a phone. He thinks some of this problem started at that time. His diabetes is diet controlled and he does not have to take any medications for a long while. His last hemoglobin A1c was 5.8. Was a former smoker and has not smoked since 1985. He is not taken any specific treatment for his present problems. Electronic Signature(s) Signed: 06/10/2014 4:59:19 PM By: Evlyn Kanner MD, FACS Entered By: Evlyn Kanner on 06/10/2014 16:03:25 James [name] ([id number]) -------------------------------------------------------------------------------- Physical Exam Details Patient Name: James [name] Date of Service: 06/10/2014 2:30 PM Medical Record Number: [id number] Patient Account Number: [id number] Date of Birth/Sex: 03-24-1933 (81 y.o. Male) Treating RN: Clover Mealy, RN, BSN, [place] Sink Primary Care Physician: Maudie Flakes Other Clinician: Referring Physician: Treating Physician/Extender: Rudene Re in Treatment: 0 Constitutional . Pulse regular. Respirations normal and unlabored. Afebrile. . Eyes Nonicteric. Reactive to light. Ears, Nose, Mouth, and Throat Lips, teeth, and gums WNL.Marland Kitchen Moist mucosa without lesions . Neck supple and nontender. No palpable supraclavicular or cervical adenopathy. Normal sized without goiter. Respiratory WNL. No retractions.. Cardiovascular Pedal Pulses WNL. No clubbing, cyanosis or edema. Gastrointestinal (GI) Abdomen without masses or tenderness.. No liver or spleen enlargement or  tenderness.. Musculoskeletal he has severe weakness and unable to walk even a few feet.. he has extensive ecchymosis and hematomas on the left flank.. Integumentary (Hair, Skin) both buttocks on either side have significant excoriation and there are some areas where he has a stage III pressure ulcer.Marland Kitchen Psychiatric Judgement and  insight Intact.. No evidence of depression, anxiety, or agitation.. Electronic Signature(s) Signed: 06/10/2014 4:59:19 PM By: Evlyn Kanner MD, FACS Entered By: Evlyn Kanner on 06/10/2014 16:06:47 James [name] ([id number]) -------------------------------------------------------------------------------- Physician Orders Details Patient Name: James [name] Date of Service: 06/10/2014 2:30 PM Medical Record Number: [id number] Patient Account Number: [id number] Date of Birth/Sex: [birth year] (81 y.o. Male) Treating RN: Curtis Sites Primary Care Physician: Maudie Flakes Other Clinician: Referring Physician: Treating Physician/Extender: Rudene Re in Treatment: 0 Verbal / Phone Orders: Yes Clinician: Curtis Sites Read Back and Verified: Yes Diagnosis Coding Wound Cleansing Wound #1 Sacrum o Clean wound with Normal Saline. o Cleanse wound with mild soap and water Anesthetic Wound #1 Sacrum o Topical Lidocaine 4% cream applied to wound bed prior to debridement Skin Barriers/Peri-Wound Care Wound #1 Sacrum o Skin Prep Primary Wound Dressing Wound #1 Sacrum o Aquacel Ag Secondary Dressing Wound #1 Sacrum o Boardered Foam Dressing Dressing Change Frequency Wound #1 Sacrum o Change dressing every other day. Follow-up Appointments Wound #1 Sacrum o Return Appointment in 1 week. Off-Loading Wound #1 Sacrum o Gel wheelchair cushion o Turn and reposition every 2 hours Lirette, Xayden ([id number]) Additional Orders / Instructions Wound #1 Sacrum o Increase protein intake. Home Health Wound #1 Sacrum o Initiate  Home Health for Skilled Nursing o Home Health Nurse may visit PRN to address patientos wound care needs. o FACE TO FACE ENCOUNTER: MEDICARE and MEDICAID PATIENTS: I certify that this patient is under my care and that I had a face-to-face encounter that meets the physician face-to-face encounter requirements with this patient on this date. The encounter with the patient was in whole or in part for the following MEDICAL CONDITION: (primary reason for Home Healthcare) MEDICAL NECESSITY: I certify, that based on my findings, NURSING services are a medically necessary home health service. HOME BOUND STATUS: I certify that my clinical findings support that this patient is homebound (i.e., Due to illness or injury, pt requires aid of supportive devices such as crutches, cane, wheelchairs, walkers, the use of special transportation or the assistance of another person to leave their place of residence. There is a normal inability to leave the home and doing so requires considerable and taxing effort. Other absences are for medical reasons / religious services and are infrequent or of short duration when for other reasons). o If current dressing causes regression in wound condition, may D/C ordered dressing product/s and apply Normal Saline Moist Dressing daily until next Wound Healing Center / Other MD appointment. Notify Wound Healing Center of regression in wound condition at [phone]. o Please direct any NON-WOUND related issues/requests for orders to patient's Primary Care Physician Electronic Signature(s) Signed: 06/10/2014 4:59:19 PM By: Evlyn Kanner MD, FACS Signed: 06/10/2014 5:45:27 PM By: Curtis Sites Entered By: Curtis Sites on 06/10/2014 15:43:22 James [name] ([id number]) -------------------------------------------------------------------------------- Problem List Details Patient Name: James [name] Date of Service: 06/10/2014 2:30 PM Medical Record Number:  [id number] Patient Account Number: [id number] Date of Birth/Sex: Jul 14, 1933 (81 y.o. Male) Treating RN: Clover Mealy, RN, BSN, Rita Primary Care Physician: Maudie Flakes Other Clinician: Referring Physician: Treating Physician/Extender: Rudene Re in Treatment: 0 Active Problems ICD-10 Encounter Code Description Active Date Diagnosis E11.622 Type 2 diabetes mellitus with other skin ulcer 06/10/2014 Yes L89.323 Pressure ulcer of left buttock, stage 3 06/10/2014 Yes L89.313 Pressure ulcer of right buttock, stage 3 06/10/2014 Yes Inactive Problems Resolved Problems Electronic Signature(s) Signed: 06/10/2014 4:59:19 PM By: Evlyn Kanner MD, FACS Entered By: Evlyn Kanner on 06/10/2014 15:55:52 Noble, Judie Petit ([id number]) --------------------------------------------------------------------------------  Progress Note Details Patient Name: James ErbDENTON, Nathanel Date of Service: 06/10/2014 2:30 PM Medical Record Number: [id number] Patient Account Number: [id number] Date of Birth/Sex: 03/11/1933 3(80 y.o. Male) Treating RN: Clover MealyAfful, RN, BSN, [place] Primary Care Physician: Maudie FlakesFeldpausch, Dale Other Clinician: Referring Physician: Treating Physician/Extender: Rudene ReBritto, Phyllip Claw Weeks in Treatment: 0 Subjective Chief Complaint Information obtained from Patient Patient presents to the wound care center for a consult due non healing wound. this 81 year old gentleman comes with bilateral gluteal ulcerations which he's had for about a month. History of Present Illness (HPI) The following HPI elements were documented for the patient's wound: Location: Patient presents with an ulcer on the right and left buttock Quality: Patient reports experiencing a dull pain to affected area(s). Severity: Patient states wound are getting worse. Duration: Patient has had the wound for > 3 months prior to seeking treatment at the wound center Timing: Pain in wound is Intermittent (comes and goes Context: The wound occurred when the  patient had a fall and injured himself on his side but he was on his bottom and dragged himself for a while until he could get to a phone. Modifying Factors: he sits in a chair for about 18 hours a day and sleeps about 6 hours. Associated Signs and Symptoms: Patient reports having difficulty standing for long periods. very pleasant 81 year old gentleman whose had chronic morbidities including asthma, diabetes mellitus type 2, chronic disease, COPD, hypertension, atrial fibrillation, SVT, CAD, cardiomyopathy, congestive heart failure. About a month ago he had a fall and during that time he had to drag himself on his bottom for a bit until he could get to a phone. He thinks some of this problem started at that time. His diabetes is diet controlled and he does not have to take any medications for a long while. His last hemoglobin A1c was 5.8. Was a former smoker and has not smoked since 1985. He is not taken any specific treatment for his present problems. Wound History Patient presents with 1 open wound that has been present for approximately on and off x 7mos. Patient has been treating wound in the following manner: triple abt. The wound has been healed in the past but has re- opened. Laboratory tests have not been performed in the last month. Patient reportedly has not tested positive for an antibiotic resistant organism. Patient reportedly has not tested positive for osteomyelitis. Patient reportedly has not had testing performed to evaluate circulation in the legs. Patient History Information obtained from Patient, Chart. James ErbDENTON, Kort ([id number]) Allergies sulfa (Reaction: rash) Social History Former smoker, Marital Status - Married, Alcohol Use - Never, Drug Use - No History. Medical History Eyes Denies history of Cataracts, Glaucoma, Optic Neuritis Respiratory Patient has history of Asthma, Chronic Obstructive Pulmonary Disease (COPD) Cardiovascular Patient has history of  Arrhythmia, Congestive Heart Failure, Coronary Artery Disease Endocrine Patient has history of Type II Diabetes Hospitalization/Surgery History - 05/16/2013, cardiac cath. - 02/05/1994, aortic valve replacement. Medical And Surgical History Notes Cardiovascular Aortic valve disease,SVT, EF 40-45%, hyperlipedrmia Endocrine obesity Review of Systems (ROS) Eyes Complains or has symptoms of Dry Eyes. Ear/Nose/Mouth/Throat The patient has no complaints or symptoms. medications he takes Tylenol, Pacerone, Cardizem CD, Lasix, Prinivil, Kadian, Flomax, Ultram, And Coumadin Objective Constitutional Pulse regular. Respirations normal and unlabored. Afebrile. Vitals Time Taken: 2:45 PM, Height: 75 in, Source: Stated, Weight: 230 lbs, Source: Stated, BMI: 28.7, Temperature: 97.8 F, Pulse: 78 bpm, Respiratory Rate: 18 breaths/min, Blood Pressure: 132/76 mmHg. Eyes Nonicteric. Reactive to light. James ErbDENTON, Bernell ([id number]) Ears,  Nose, Mouth, and Throat Lips, teeth, and gums WNL.Marland Kitchen. Moist mucosa without lesions . Neck supple and nontender. No palpable supraclavicular or cervical adenopathy. Normal sized without goiter. Respiratory WNL. No retractions.. Cardiovascular Pedal Pulses WNL. No clubbing, cyanosis or edema. Gastrointestinal (GI) Abdomen without masses or tenderness.. No liver or spleen enlargement or tenderness.. Musculoskeletal he has severe weakness and unable to walk even a few feet.. he has extensive ecchymosis and hematomas on the left flank.Marland Kitchen. Psychiatric Judgement and insight Intact.. No evidence of depression, anxiety, or agitation.. Integumentary (Hair, Skin) both buttocks on either side have significant excoriation and there are some areas where he has a stage III pressure ulcer.. Wound #1 status is Open. Original cause of wound was Pressure Injury. The wound is located on the Right Gluteus. The wound measures 12cm length x 9cm width x 0.1cm depth; 84.823cm^2 area and  8.482cm^3 volume. The wound is limited to skin breakdown. There is no tunneling or undermining noted. There is a medium amount of serosanguineous drainage noted. The wound margin is indistinct and nonvisible. There is medium (34-66%) pink, pale granulation within the wound bed. There is a small (1-33%) amount of necrotic tissue within the wound bed including Adherent Slough. The periwound skin appearance exhibited: Moist. The periwound skin appearance did not exhibit: Callus, Crepitus, Excoriation, Fluctuance, Friable, Induration, Localized Edema, Rash, Scarring, Dry/Scaly, Maceration, Atrophie Blanche, Cyanosis, Ecchymosis, Hemosiderin Staining, Mottled, Pallor, Rubor, Erythema. Periwound temperature was noted as No Abnormality. Assessment Active Problems ICD-10 E11.622 - Type 2 diabetes mellitus with other skin ulcer L89.323 - Pressure ulcer of left buttock, stage 3 Lamoreaux, Pearson ([id number]) L89.313 - Pressure ulcer of right buttock, stage 3 elderly gentleman with general debility and unable to walk around. He likes to sit for long periods of time watching sports and at times it's about 18 hours a day on his chair. Besides the excoriation and stage III pressure ulcers which we will treat with silver alginate and appropriate foam dressings, I have spent a great deal of time discussing with him and his wife the need for offloading and the need for constantly changing position. Also discussed nutrition and appropriate vitamin supplements including vitamin C zinc and a multivitamin tablet. His wife said they're both going to be compliant and we will see him on a regular interval, on weekly basis. Plan Wound Cleansing: Wound #1 Sacrum: Clean wound with Normal Saline. Cleanse wound with mild soap and water Anesthetic: Wound #1 Sacrum: Topical Lidocaine 4% cream applied to wound bed prior to debridement Skin Barriers/Peri-Wound Care: Wound #1 Sacrum: Skin Prep Primary Wound  Dressing: Wound #1 Sacrum: Aquacel Ag Secondary Dressing: Wound #1 Sacrum: Boardered Foam Dressing Dressing Change Frequency: Wound #1 Sacrum: Change dressing every other day. Follow-up Appointments: Wound #1 Sacrum: Return Appointment in 1 week. Off-Loading: Wound #1 Sacrum: Gel wheelchair cushion Turn and reposition every 2 hours Additional Orders / Instructions: Wound #1 SacrumThana Farr: Zubiate, Kaidence ([id number]) Increase protein intake. Home Health: Wound #1 Sacrum: Initiate Home Health for Skilled Nursing Home Health Nurse may visit PRN to address patient s wound care needs. FACE TO FACE ENCOUNTER: MEDICARE and MEDICAID PATIENTS: I certify that this patient is under my care and that I had a face-to-face encounter that meets the physician face-to-face encounter requirements with this patient on this date. The encounter with the patient was in whole or in part for the following MEDICAL CONDITION: (primary reason for Home Healthcare) MEDICAL NECESSITY: I certify, that based on my findings, NURSING services are a medically necessary  home health service. HOME BOUND STATUS: I certify that my clinical findings support that this patient is homebound (i.e., Due to illness or injury, pt requires aid of supportive devices such as crutches, cane, wheelchairs, walkers, the use of special transportation or the assistance of another person to leave their place of residence. There is a normal inability to leave the home and doing so requires considerable and taxing effort. Other absences are for medical reasons / religious services and are infrequent or of short duration when for other reasons). If current dressing causes regression in wound condition, may D/C ordered dressing product/s and apply Normal Saline Moist Dressing daily until next Wound Healing Center / Other MD appointment. Notify Wound Healing Center of regression in wound condition at [phone]. Please direct any NON-WOUND related  issues/requests for orders to patient's Primary Care Physician elderly gentleman with general debility and unable to walk around. He likes to sit for long periods of time watching sports and at times it's about 18 hours a day on his chair. Besides the excoriation and stage III pressure ulcers which we will treat with silver alginate and appropriate foam dressings, I have spent a great deal of time discussing with him and his wife the need for offloading and the need for constantly changing position. Also discussed nutrition and appropriate vitamin supplements including vitamin C zinc and a multivitamin tablet. His wife said they're both going to be compliant and we will see him on a regular interval, on weekly basis. Electronic Signature(s) Signed: 06/11/2014 12:08:58 PM By: Evlyn Kanner MD, FACS Previous Signature: 06/10/2014 4:59:19 PM Version By: Evlyn Kanner MD, FACS Entered By: Evlyn Kanner on 06/11/2014 12:06:14 James [name] ([id number]) -------------------------------------------------------------------------------- ROS/PFSH Details Patient Name: James [name] Date of Service: 06/10/2014 2:30 PM Medical Record Number: [id number] Patient Account Number: [id number] Date of Birth/Sex: 02-05-1934 (81 y.o. Male) Treating RN: Clover Mealy, RN, BSN, Rita Primary Care Physician: Maudie Flakes Other Clinician: Referring Physician: Treating Physician/Extender: Rudene Re in Treatment: 0 Information Obtained From Patient Chart Wound History Do you currently have one or more open woundso Yes How many open wounds do you currently haveo 1 Approximately how long have you had your woundso on and off x 7mos How have you been treating your wound(s) until nowo triple abt Has your wound(s) ever healed and then re-openedo Yes Have you had any lab work done in the past montho No Have you tested positive for an antibiotic resistant organism (MRSA, VRE)o No Have you tested positive for  osteomyelitis (bone infection)o No Have you had any tests for circulation on your legso No Eyes Complaints and Symptoms: Positive for: Dry Eyes Medical History: Negative for: Cataracts; Glaucoma; Optic Neuritis Ear/Nose/Mouth/Throat Complaints and Symptoms: No Complaints or Symptoms Respiratory Medical History: Positive for: Asthma; Chronic Obstructive Pulmonary Disease (COPD) Cardiovascular Medical History: Positive for: Arrhythmia; Congestive Heart Failure; Coronary Artery Disease Past Medical History Notes: Aortic valve disease,SVT, EF 40-45%, hyperlipedrmia Endocrine Medical History: Positive for: Type II Diabetes Past Medical History NotesLORON, WEIMER ([id number]) obesity Hospitalization / Surgery History Name of Hospital Purpose of Hospitalization/Surgery Date cardiac cath 05/16/2013 aortic valve replacement 02/05/1994 Family and Social History Former smoker; Marital Status - Married; Alcohol Use: Never; Drug Use: No History Physician Affirmation I have reviewed and agree with the above information. Electronic Signature(s) Signed: 06/10/2014 4:59:19 PM By: Evlyn Kanner MD, FACS Signed: 06/10/2014 5:21:09 PM By: Elpidio Eric BSN, RN Previous Signature: 06/10/2014 12:22:46 PM Version By: Evlyn Kanner MD, FACS Entered By: Evlyn Kanner on 06/10/2014  15:55:40 

## 2014-06-11 NOTE — Progress Notes (Signed)
James [name], James [name] ([id number]) Visit Report for 06/10/2014 Allergy List Details Patient Name: James [name], [name] Date of Service: 06/10/2014 2:30 PM Medical Record Number: [id number] Patient Account Number: [id number] Date of Birth/Sex: 07/15/1933 [age] y.o. Male) Treating RN: Clover MealyAfful, RN, BSN, [place] Primary Care Physician: Maudie FlakesFeldpausch, Dale Other Clinician: Referring Physician: Treating Physician/Extender: Rudene ReBritto, Errol Weeks in Treatment: 0 Allergies Active Allergies sulfa Reaction: rash Allergy Notes Electronic Signature(s) Signed: 06/10/2014 5:21:09 PM By: Elpidio EricAfful, Rita BSN, RN Entered By: Elpidio EricAfful, Rita on 06/10/2014 11:24:14 James [name], Jazmine ([id number]) -------------------------------------------------------------------------------- Arrival Information Details Patient Name: James [name], James [name] Date of Service: 06/10/2014 2:30 PM Medical Record Number: [id number] Patient Account Number: [id number] Date of Birth/Sex: 01/17/1934 [age] y.o. Male) Treating RN: Clover MealyAfful, RN, BSN, [place] Primary Care Physician: Maudie FlakesFeldpausch, Dale Other Clinician: Referring Physician: Treating Physician/Extender: Rudene ReBritto, Errol Weeks in Treatment: 0 Visit Information Patient Arrived: Wheel Chair Arrival Time: 14:45 Accompanied By: wife Transfer Assistance: None Patient Identification Verified: Yes Secondary Verification Process Yes Completed: Patient Requires Transmission-Based No Precautions: Patient Has Alerts: No Electronic Signature(s) Signed: 06/10/2014 5:21:09 PM By: Elpidio EricAfful, Rita BSN, RN Entered By: Elpidio EricAfful, Rita on 06/10/2014 14:46:09 James [name], Alson ([id number]) -------------------------------------------------------------------------------- Clinic Level of Care Assessment Details Patient Name: James [name], James [name] Date of Service: 06/10/2014 2:30 PM Medical Record Number: [id number] Patient Account Number: [id number] Date of Birth/Sex: 04/30/1933 [age] y.o. Male) Treating RN: Curtis Sitesorthy, Joanna Primary Care Physician: Maudie FlakesFeldpausch,  Dale Other Clinician: Referring Physician: Treating Physician/Extender: Rudene ReBritto, Errol Weeks in Treatment: 0 Clinic Level of Care Assessment Items TOOL 2 Quantity Score []  - Use when only an EandM is performed on the INITIAL visit 0 ASSESSMENTS - Nursing Assessment / Reassessment X - General Physical Exam (combine w/ comprehensive assessment (listed just 1 20 below) when performed on new pt. evals) X - Comprehensive Assessment (HX, ROS, Risk Assessments, Wounds Hx, etc.) 1 25 ASSESSMENTS - Wound and Skin Assessment / Reassessment X - Simple Wound Assessment / Reassessment - one wound 1 5 []  - Complex Wound Assessment / Reassessment - multiple wounds 0 []  - Dermatologic / Skin Assessment (not related to wound area) 0 ASSESSMENTS - Ostomy and/or Continence Assessment and Care []  - Incontinence Assessment and Management 0 []  - Ostomy Care Assessment and Management (repouching, etc.) 0 PROCESS - Coordination of Care []  - Simple Patient / Family Education for ongoing care 0 X - Complex (extensive) Patient / Family Education for ongoing care 1 20 X - Staff obtains ChiropractorConsents, Records, Test Results / Process Orders 1 10 []  - Staff telephones HHA, Nursing Homes / Clarify orders / etc 0 []  - Routine Transfer to another Facility (non-emergent condition) 0 []  - Routine Hospital Admission (non-emergent condition) 0 X - New Admissions / Manufacturing engineernsurance Authorizations / Ordering NPWT, Apligraf, etc. 1 15 []  - Emergency Hospital Admission (emergent condition) 0 X - Simple Discharge Coordination 1 10 Schoonmaker, James [name] ([id number]) []  - Complex (extensive) Discharge Coordination 0 PROCESS - Special Needs []  - Pediatric / Minor Patient Management 0 []  - Isolation Patient Management 0 []  - Hearing / Language / Visual special needs 0 []  - Assessment of Community assistance (transportation, D/C planning, etc.) 0 []  - Additional assistance / Altered mentation 0 []  - Support Surface(s) Assessment (bed, cushion,  seat, etc.) 0 INTERVENTIONS - Wound Cleansing / Measurement X - Wound Imaging (photographs - any number of wounds) 1 5 []  - Wound Tracing (instead of photographs) 0 X - Simple Wound Measurement - one wound 1 5 []  - Complex Wound Measurement - multiple wounds 0 X - Simple Wound Cleansing - one  wound 1 5  - Complex Wound Cleansing - multiple wounds 0 INTERVENTIONS - Wound Dressings X - Small Wound Dressing one or multiple wounds 1 10  - Medium Wound Dressing one or multiple wounds 0  - Large Wound Dressing one or multiple wounds 0  - Application of Medications - injection 0 INTERVENTIONS - Miscellaneous  - External ear exam 0  - Specimen Collection (cultures, biopsies, blood, body fluids, etc.) 0  - Specimen(s) / Culture(s) sent or taken to Lab for analysis 0  - Patient Transfer (multiple staff / Michiel Sites Lift / Similar devices) 0  - Simple Staple / Suture removal (25 or less) 0  - Complex Staple / Suture removal (26 or more) 0 Heindl, Zachariah ([id number])  - Hypo / Hyperglycemic Management (close monitor of Blood Glucose) 0  - Ankle / Brachial Index (ABI) - do not check if billed separately 0 Has the patient been seen at the hospital within the last three years: Yes Total Score: 130 Level Of Care: New/Established - Level 4 Electronic Signature(s) Signed: 06/10/2014 5:45:27 PM By: Curtis Sites Entered By: Curtis Sites on 06/10/2014 15:44:28 James [name] ([id number]) -------------------------------------------------------------------------------- Encounter Discharge Information Details Patient Name: James [name] Date of Service: 06/10/2014 2:30 PM Medical Record Number: [id number] Patient Account Number: [id number] Date of Birth/Sex: 1933-08-27 (81 y.o. Male) Treating RN: Clover Mealy, RN, BSN, [place] Primary Care Physician: Maudie Flakes Other Clinician: Referring Physician: Treating Physician/Extender: Rudene Re in Treatment: 0 Encounter Discharge  Information Items Schedule Follow-up Appointment: No Medication Reconciliation completed No and provided to Patient/Care Shammara Jarrett: Provided on Clinical Summary of Care: 06/10/2014 Form Type Recipient Paper Patient MD Electronic Signature(s) Signed: 06/10/2014 4:01:16 PM By: Gwenlyn Perking Entered By: Gwenlyn Perking on 06/10/2014 16:01:16 James [name] ([id number]) -------------------------------------------------------------------------------- Lower Extremity Assessment Details Patient Name: James [name] Date of Service: 06/10/2014 2:30 PM Medical Record Number: [id number] Patient Account Number: [id number] Date of Birth/Sex: 01/22/1934 (81 y.o. Male) Treating RN: Clover Mealy, RN, BSN, Rita Primary Care Physician: Maudie Flakes Other Clinician: Referring Physician: Treating Physician/Extender: Rudene Re in Treatment: 0 Electronic Signature(s) Signed: 06/10/2014 5:21:09 PM By: Elpidio Eric BSN, RN Entered By: Elpidio Eric on 06/10/2014 14:47:04 James [name] ([id number]) -------------------------------------------------------------------------------- Multi Wound Chart Details Patient Name: James [name] Date of Service: 06/10/2014 2:30 PM Medical Record Number: [id number] Patient Account Number: [id number] Date of Birth/Sex: 11/04/1933 (81 y.o. Male) Treating RN: Curtis Sites Primary Care Physician: Maudie Flakes Other Clinician: Referring Physician: Treating Physician/Extender: Rudene Re in Treatment: 0 Vital Signs Height(in): 75 Pulse(bpm): 78 Weight(lbs): 230 Blood Pressure (mmHg): Body Mass Index(BMI): 29 Temperature(F): 97.8 Respiratory Rate 18 (breaths/min): Photos: [1:No Photos] [N/A:N/A] Wound Location: [1:Sacrum] [N/A:N/A] Wounding Event: [1:Pressure Injury] [N/A:N/A] Primary Etiology: [1:Pressure Ulcer] [N/A:N/A] Comorbid History: [1:Asthma, Chronic Obstructive Pulmonary Disease (COPD), Arrhythmia, Congestive Heart Failure, Coronary Artery  Disease, Type II Diabetes] [N/A:N/A] Date Acquired: [1:05/11/2014] [N/A:N/A] Weeks of Treatment: [1:0] [N/A:N/A] Wound Status: [1:Open] [N/A:N/A] Measurements L x W x D 12x9x0.1 [N/A:N/A] (cm) Area (cm) : [7:56.433] [N/A:N/A] Volume (cm) : [1:8.482] [N/A:N/A] % Reduction in Area: [1:0.00%] [N/A:N/A] % Reduction in Volume: 0.00% [N/A:N/A] Classification: [1:Category/Stage III] [N/A:N/A] Exudate Amount: [1:Medium] [N/A:N/A] Exudate Type: [1:Serosanguineous] [N/A:N/A] Exudate Color: [1:red, brown] [N/A:N/A] Wound Margin: [1:Indistinct, nonvisible] [N/A:N/A] Granulation Amount: [1:Medium (34-66%)] [N/A:N/A] Granulation Quality: [1:Pink, Pale] [N/A:N/A] Necrotic Amount: [1:Small (1-33%)] [N/A:N/A] Exposed Structures: [1:Fascia: No Fat: No] [N/A:N/A] Tendon: No Muscle: No Joint: No Bone: No Limited to Skin Breakdown Periwound Skin Texture: Edema: No N/A N/A Excoriation: No Induration: No Callus: No Crepitus: No Fluctuance: No  Friable: No Rash: No Scarring: No Periwound Skin Moist: Yes N/A N/A Moisture: Maceration: No Dry/Scaly: No Periwound Skin Color: Atrophie Blanche: No N/A N/A Cyanosis: No Ecchymosis: No Erythema: No Hemosiderin Staining: No Mottled: No Pallor: No Rubor: No Temperature: No Abnormality N/A N/A Tenderness on No N/A N/A Palpation: Wound Preparation: Ulcer Cleansing: N/A N/A Rinsed/Irrigated with Saline Topical Anesthetic Applied: Other: lidocaine 4% Treatment Notes Electronic Signature(s) Signed: 06/10/2014 5:45:27 PM By: Curtis Sites Entered By: Curtis Sites on 06/10/2014 15:41:12 James [name] ([id number]) -------------------------------------------------------------------------------- Multi-Disciplinary Care Plan Details Patient Name: James [name] Date of Service: 06/10/2014 2:30 PM Medical Record Number: [id number] Patient Account Number: [id number] Date of Birth/Sex: 1933/05/09 (81 y.o. Male) Treating RN: Curtis Sites Primary  Care Physician: Maudie Flakes Other Clinician: Referring Physician: Treating Physician/Extender: Rudene Re in Treatment: 0 Active Inactive Abuse / Safety / Falls / Self Care Management Nursing Diagnoses: Potential for falls Goals: Patient will remain injury free Date Initiated: 06/10/2014 Goal Status: Active Interventions: Assess fall risk on admission and as needed Notes: Nutrition Nursing Diagnoses: Potential for alteratiion in Nutrition/Potential for imbalanced nutrition Goals: Patient/caregiver agrees to and verbalizes understanding of need to use nutritional supplements and/or vitamins as prescribed Date Initiated: 06/10/2014 Goal Status: Active Interventions: Assess patient nutrition upon admission and as needed per policy Notes: Orientation to the Wound Care Program Nursing Diagnoses: Knowledge deficit related to the wound healing center program Goals: Patient/caregiver will verbalize understanding of the Wound Healing Center Program SHA, [facility] ([id number]) Date Initiated: 06/10/2014 Goal Status: Active Interventions: Provide education on orientation to the wound center Notes: Pressure Nursing Diagnoses: Potential for impaired tissue integrity related to pressure, friction, moisture, and shear Goals: Patient will remain free of pressure ulcers Date Initiated: 06/10/2014 Goal Status: Active Interventions: Assess potential for pressure ulcer upon admission and as needed Notes: Wound/Skin Impairment Nursing Diagnoses: Knowledge deficit related to ulceration/compromised skin integrity Goals: Ulcer/skin breakdown will have a volume reduction of 30% by week 4 Date Initiated: 06/10/2014 Goal Status: Active Interventions: Assess ulceration(s) every visit Notes: Electronic Signature(s) Signed: 06/10/2014 5:45:27 PM By: Curtis Sites Entered By: Curtis Sites on 06/10/2014 15:45:40 James [name]  ([id number]) -------------------------------------------------------------------------------- Pain Assessment Details Patient Name: James [name] Date of Service: 06/10/2014 2:30 PM Medical Record Number: [id number] Patient Account Number: [id number] Date of Birth/Sex: 04/01/1933 (81 y.o. Male) Treating RN: Clover Mealy, RN, BSN, Rita Primary Care Physician: Maudie Flakes Other Clinician: Referring Physician: Treating Physician/Extender: Rudene Re in Treatment: 0 Active Problems Location of Pain Severity and Description of Pain Patient Has Paino No Site Locations Pain Management and Medication Current Pain Management: Electronic Signature(s) Signed: 06/10/2014 5:21:09 PM By: Elpidio Eric BSN, RN Entered By: Elpidio Eric on 06/10/2014 14:46:34 James [name] ([id number]) -------------------------------------------------------------------------------- Wound Assessment Details Patient Name: James [name] Date of Service: 06/10/2014 2:30 PM Medical Record Number: [id number] Patient Account Number: [id number] Date of Birth/Sex: [birth year] (81 y.o. Male) Treating RN: Clover Mealy, RN, BSN, Rita Primary Care Physician: Maudie Flakes Other Clinician: Referring Physician: Treating Physician/Extender: Rudene Re in Treatment: 0 Wound Status Wound Number: 1 Primary Pressure Ulcer Etiology: Wound Location: Right Gluteus Wound Open Wounding Event: Pressure Injury Status: Date Acquired: 05/11/2014 Comorbid Asthma, Chronic Obstructive Pulmonary Weeks Of Treatment: 0 History: Disease (COPD), Arrhythmia, Clustered Wound: No Congestive Heart Failure, Coronary Artery Disease, Type II Diabetes Photos Wound Measurements Length: (cm) 12 Width: (cm) 9 Depth: (cm) 0.1 Area: (cm) 84.823 Volume: (cm) 8.482 % Reduction in Area: 0% % Reduction in Volume: 0% Epithelialization: None Tunneling: No Undermining: No Wound Description Classification:  Category/Stage III Wound Margin:  Indistinct, nonvisible Exudate Amount: Medium Exudate Type: Serosanguineous Exudate Color: red, brown Foul Odor After Cleansing: No Wound Bed Granulation Amount: Medium (34-66%) Exposed Structure Granulation Quality: Pink, Pale Fascia Exposed: No Necrotic Amount: Small (1-33%) Fat Layer Exposed: No Necrotic Quality: Adherent Slough Tendon Exposed: No [place], Domnique ([id number]) Muscle Exposed: No Joint Exposed: No Bone Exposed: No Limited to Skin Breakdown Periwound Skin Texture Texture Color No Abnormalities Noted: No No Abnormalities Noted: No Callus: No Atrophie Blanche: No Crepitus: No Cyanosis: No Excoriation: No Ecchymosis: No Fluctuance: No Erythema: No Friable: No Hemosiderin Staining: No Induration: No Mottled: No Localized Edema: No Pallor: No Rash: No Rubor: No Scarring: No Temperature / Pain Moisture Temperature: No Abnormality No Abnormalities Noted: No Dry / Scaly: No Maceration: No Moist: Yes Wound Preparation Ulcer Cleansing: Rinsed/Irrigated with Saline Topical Anesthetic Applied: Other: lidocaine 4%, Electronic Signature(s) Signed: 06/10/2014 5:21:09 PM By: Elpidio EricAfful, Rita BSN, RN Entered By: Elpidio EricAfful, Rita on 06/10/2014 17:12:48 James [name], Liron ([id number]) -------------------------------------------------------------------------------- Vitals Details Patient Name: James [name], Laith Date of Service: 06/10/2014 2:30 PM Medical Record Number: [id number] Patient Account Number: [id number] Date of Birth/Sex: 05/21/1933 [age] y.o. Male) Treating RN: Afful, RN, BSN, Rita Primary Care Physician: Maudie FlakesFeldpausch, Dale Other Clinician: Referring Physician: Treating Physician/Extender: Rudene ReBritto, Errol Weeks in Treatment: 0 Vital Signs Time Taken: 14:45 Temperature (F): 97.8 Height (in): 75 Pulse (bpm): 78 Source: Stated Respiratory Rate (breaths/min): 18 Weight (lbs): 230 Blood Pressure (mmHg): 132/76 Source: Stated Reference Range: 80 - 120 mg / dl Body  Mass Index (BMI): 28.7 Electronic Signature(s) Signed: 06/10/2014 5:21:09 PM By: Elpidio EricAfful, Rita BSN, RN Entered By: Elpidio EricAfful, Rita on 06/10/2014 17:11:33

## 2014-06-11 NOTE — Progress Notes (Signed)
James [name], James [name] ([id number]) Visit Report for 06/10/2014 Abuse/Suicide Risk Screen Details Patient Name: James [name], James [name] Date of Service: 06/10/2014 2:30 PM Medical Record Number: [id number] Patient Account Number: [id number] Date of Birth/Sex: 10/09/1933 [age] y.o. Male) Treating RN: Clover MealyAfful, RN, BSN, [place] Sinkita Primary Care Physician: Maudie FlakesFeldpausch, Dale Other Clinician: Referring Physician: Treating Physician/Extender: Rudene ReBritto, Errol Weeks in Treatment: 0 Abuse/Suicide Risk Screen Items Answer ABUSE/SUICIDE RISK SCREEN: Do you feel uncomfortable with anyone in your familyo No Has anyone forced you do things that you didnot want to doo No Do you have any thoughts of harming yourselfo No Patient displays signs or symptoms of abuse and/or neglect. No Electronic Signature(s) Signed: 06/10/2014 5:21:09 PM By: Elpidio EricAfful, Rita BSN, RN Entered By: Elpidio EricAfful, Rita on 06/10/2014 14:47:18 James [name], James [name] ([id number]) -------------------------------------------------------------------------------- Activities of Daily Living Details Patient Name: James [name], James [name] Date of Service: 06/10/2014 2:30 PM Medical Record Number: [id number] Patient Account Number: [id number] Date of Birth/Sex: 01/14/1934 [age] y.o. Male) Treating RN: Clover MealyAfful, RN, BSN, [place] Sink[place] Primary Care Physician: Maudie FlakesFeldpausch, Dale Other Clinician: Referring Physician: Treating Physician/Extender: Rudene ReBritto, Errol Weeks in Treatment: 0 Activities of Daily Living Items Answer Activities of Daily Living (Please select one for each item) Drive Automobile Not Able Take Medications Need Assistance Use Telephone Need Assistance Care for Appearance Need Assistance Use Toilet Need Assistance Bath / Shower Need Assistance Dress Self Need Assistance Feed Self Need Assistance Walk Need Assistance Get In / Out Bed Need Assistance Housework Need Assistance Prepare Meals Need Assistance Handle Money Need Assistance Shop for Self Not Able Electronic Signature(s) Signed:  06/10/2014 5:21:09 PM By: Elpidio EricAfful, Rita BSN, RN Entered By: Elpidio EricAfful, Rita on 06/10/2014 14:53:30 James [name], James [name] ([id number]) -------------------------------------------------------------------------------- Education Assessment Details Patient Name: James [name], James [name] Date of Service: 06/10/2014 2:30 PM Medical Record Number: [id number] Patient Account Number: [id number] Date of Birth/Sex: 06/22/1933 [age] y.o. Male) Treating RN: Clover MealyAfful, RN, BSN, [place] S[place] Primary Care Physician: Maudie FlakesFeldpausch, Dale Other Clinician: Referring Physician: Treating Physician/Extender: Rudene ReBritto, Errol Weeks in Treatment: 0 Primary Learner Assessed: Patient Learning Preferences/Education Level/Primary Language Learning Preference: Explanation Highest Education Level: College or Above Preferred Language: English Cognitive Barrier Assessment/Beliefs Language Barrier: No Physical Barrier Assessment Impaired Vision: Yes Glasses Impaired Hearing: No Decreased Hand dexterity: No Knowledge/Comprehension Assessment Knowledge Level: Medium Comprehension Level: Medium Ability to understand written Medium instructions: Ability to understand verbal Medium instructions: Motivation Assessment Anxiety Level: Calm Cooperation: Cooperative Education Importance: Acknowledges Need Interest in Health Problems: Asks Questions Willingness to Engage in Self- Medium Management Activities: Readiness to Engage in Self- Medium Management Activities: Electronic Signature(s) Signed: 06/10/2014 5:21:09 PM By: Elpidio EricAfful, Rita BSN, RN Entered By: Elpidio EricAfful, Rita on 06/10/2014 14:57:35 James [name], James [name] ([id number]) -------------------------------------------------------------------------------- Fall Risk Assessment Details Patient Name: James [name], James [name] Date of Service: 06/10/2014 2:30 PM Medical Record Number: [id number] Patient Account Number: [id number] Date of Birth/Sex: 07/09/1933 [age] y.o. Male) Treating RN: Clover MealyAfful, RN, BSN, [place] S[place] Primary Care Physician:  Maudie FlakesFeldpausch, Dale Other Clinician: Referring Physician: Treating Physician/Extender: Rudene ReBritto, Errol Weeks in Treatment: 0 Fall Risk Assessment Items FALL RISK ASSESSMENT: History of falling - immediate or within 3 months 25 Yes Secondary diagnosis 0 No Ambulatory aid None/bed rest/wheelchair/nurse 0 Yes Crutches/cane/walker 0 No Furniture 0 No IV Access/Saline Lock 0 No Gait/Training Normal/bed rest/immobile 0 No Weak 10 Yes Impaired 20 Yes Mental Status Oriented to own ability 0 Yes Electronic Signature(s) Signed: 06/10/2014 5:21:09 PM By: Elpidio EricAfful, Rita BSN, RN Entered By: Elpidio EricAfful, Rita on 06/10/2014 14:58:32 James [name], James [name] ([id number]) -------------------------------------------------------------------------------- Foot Assessment Details Patient Name: James [name], James [name] Date of Service: 06/10/2014 2:30 PM Medical Record Number: [id number] Patient  Account Number: [id number] Date of Birth/Sex: 02/12/1933 [age] y.o. Male) Treating RN: Clover MealyAfful, RN, BSN, [place] Primary Care Physician: Maudie FlakesFeldpausch, Dale Other Clinician: Referring Physician: Treating Physician/Extender: Rudene ReBritto, Errol Weeks in Treatment: 0 Foot Assessment Items Site Locations + = Sensation present, - = Sensation absent, C = Callus, U = Ulcer R = Redness, W = Warmth, M = Maceration, PU = Pre-ulcerative lesion F = Fissure, S = Swelling, D = Dryness Assessment Right: Left: Other Deformity: No No Prior Foot Ulcer: No No Prior Amputation: No No Charcot Joint: No No Ambulatory Status: Ambulatory With Help Assistance Device: Walker Gait: Surveyor, miningUnsteady Electronic Signature(s) Signed: 06/10/2014 5:21:09 PM By: Elpidio EricAfful, Rita BSN, RN Entered By: Elpidio EricAfful, Rita on 06/10/2014 14:59:16 James [name], Aqeel ([id number]) -------------------------------------------------------------------------------- Nutrition Risk Assessment Details Patient Name: James [name], Champ Date of Service: 06/10/2014 2:30 PM Medical Record Number: [id number] Patient Account  Number: [id number] Date of Birth/Sex: 12/23/1933 [age] y.o. Male) Treating RN: Clover MealyAfful, RN, BSN, Rita Primary Care Physician: Maudie FlakesFeldpausch, Dale Other Clinician: Referring Physician: Treating Physician/Extender: Rudene ReBritto, Errol Weeks in Treatment: 0 Height (in): 75 Weight (lbs): Body Mass Index (BMI): Nutrition Risk Assessment Items NUTRITION RISK SCREEN: I have an illness or condition that made me change the kind and/or 0 No amount of food I eat I eat fewer than two meals per day 0 No I eat few fruits and vegetables, or milk products 0 No I have three or more drinks of beer, liquor or wine almost every day 0 No I have tooth or mouth problems that make it hard for me to eat 0 No I don't always have enough money to buy the food I need 0 No I eat alone most of the time 0 No I take three or more different prescribed or over-the-counter drugs a 0 No day Without wanting to, I have lost or gained 10 pounds in the last six 0 No months I am not always physically able to shop, cook and/or feed myself 0 No Nutrition Protocols Good Risk Protocol 0 No interventions needed Moderate Risk Protocol Electronic Signature(s) Signed: 06/10/2014 5:21:09 PM By: Elpidio EricAfful, Rita BSN, RN Entered By: Elpidio EricAfful, Rita on 06/10/2014 14:58:44

## 2014-06-17 ENCOUNTER — Encounter: Payer: Medicare Other | Admitting: Surgery

## 2014-06-17 DIAGNOSIS — L89313 Pressure ulcer of right buttock, stage 3: Secondary | ICD-10-CM | POA: Diagnosis not present

## 2014-06-18 NOTE — Progress Notes (Addendum)
AHNAF, CAPONI ([id number]) Visit Report for 06/17/2014 Chief Complaint Document Details Patient Name: James [name], James [name] Date of Service: 06/17/2014 11:00 AM Medical Record Number: [id number] Patient Account Number: [id number] Date of Birth/Sex: Mar 18, 1933 (82 y.o. Male) Treating RN: Primary Care Physician: Maudie Flakes Other Clinician: Referring Physician: Maudie Flakes Treating Physician/Extender: Rudene Re in Treatment: 1 Information Obtained from: Patient Chief Complaint Patient presents to the wound care center for a consult due non healing wound. this 81 year old gentleman comes with bilateral gluteal ulcerations which he's had for about a month. Electronic Signature(s) Signed: 06/17/2014 12:26:09 PM By: Evlyn Kanner MD, FACS Entered By: Evlyn Kanner on 06/17/2014 11:59:11 James [name] ([id number]) -------------------------------------------------------------------------------- Debridement Details Patient Name: James [name] Date of Service: 06/17/2014 11:00 AM Medical Record Number: [id number] Patient Account Number: [id number] Date of Birth/Sex: 1933-02-08 (81 y.o. Male) Treating RN: Primary Care Physician: Maudie Flakes Other Clinician: Referring Physician: Maudie Flakes Treating Physician/Extender: Rudene Re in Treatment: 1 Debridement Performed for Wound #1 Left Gluteus Assessment: Performed By: Physician Tristan Schroeder., MD Debridement: Open Wound/Selective Debridement Selective Description: Pre-procedure Yes Verification/Time Out Taken: Start Time: 11:52 Pain Control: Lidocaine 4% Topical Solution Total Area Debrided (L x 2.1 (cm) x 2.5 (cm) = 5.25 (cm) W): Tissue and other Non-Viable, Eschar, Exudate, Fibrin/Slough material debrided: Instrument: Other Bleeding: Minimum Hemostasis Achieved: Pressure End Time: 11:54 Procedural Pain: 0 Post Procedural Pain: 0 Response to Treatment: Procedure was tolerated well Post  Debridement Measurements of Total Wound Length: (cm) 2.1 Stage: Category/Stage III Width: (cm) 2.5 Depth: (cm) 0.1 Volume: (cm) 0.412 Electronic Signature(s) Signed: 06/17/2014 12:26:09 PM By: Evlyn Kanner MD, FACS Entered By: Evlyn Kanner on 06/17/2014 12:02:45 James [name] ([id number]) -------------------------------------------------------------------------------- HPI Details Patient Name: James [name] Date of Service: 06/17/2014 11:00 AM Medical Record Number: [id number] Patient Account Number: [id number] Date of Birth/Sex: 08-Dec-1933 (81 y.o. Male) Treating RN: Primary Care Physician: Maudie Flakes Other Clinician: Referring Physician: Maudie Flakes Treating Physician/Extender: Rudene Re in Treatment: 1 History of Present Illness Location: Patient presents with an ulcer on the right and left buttock Quality: Patient reports experiencing a dull pain to affected area(s). Severity: Patient states wound are getting worse. Duration: Patient has had the wound for > 3 months prior to seeking treatment at the wound center Timing: Pain in wound is Intermittent (comes and goes Context: The wound occurred when the patient had a fall and injured himself on his side but he was on his bottom and dragged himself for a while until he could get to a phone. Modifying Factors: he sits in a chair for about 18 hours a day and sleeps about 6 hours. Associated Signs and Symptoms: Patient reports having difficulty standing for long periods. HPI Description: very pleasant 81 year old gentleman whose had chronic morbidities including asthma, diabetes mellitus type 2, chronic disease, COPD, hypertension, atrial fibrillation, SVT, CAD, cardiomyopathy, congestive heart failure. About a month ago he had a fall and during that time he had to drag himself on his bottom for a bit until he could get to a phone. He thinks some of this problem started at that time. His diabetes is diet  controlled and he does not have to take any medications for a long while. His last hemoglobin A1c was 5.8. Was a former smoker and has not smoked since 1985. He is not taken any specific treatment for his present problems. 06/17/2014 -- he has been very diligent in offloading as much as possible through the entire week and this shows in the excellent healing he  has achieved. Electronic Signature(s) Signed: 06/17/2014 12:26:09 PM By: Evlyn KannerBritto, Kirat Mezquita MD, FACS Entered By: Evlyn KannerBritto, Farrell Pantaleo on 06/17/2014 11:59:39 James ErbENTON, Darius ([id number]) -------------------------------------------------------------------------------- Physical Exam Details Patient Name: James [name] Date of Service: 06/17/2014 11:00 AM Medical Record Number: [id number] Patient Account Number: [id number] Date of Birth/Sex: 03/08/1933 39(80 y.o. Male) Treating RN: Primary Care Physician: Maudie FlakesFeldpausch, Dale Other Clinician: Referring Physician: Maudie FlakesFeldpausch, Dale Treating Physician/Extender: Rudene ReBritto, Levetta Bognar Weeks in Treatment: 1 Constitutional . Pulse regular. Respirations normal and unlabored. Afebrile. . Eyes Nonicteric. Reactive to light. Ears, Nose, Mouth, and Throat Lips, teeth, and gums WNL.Marland Kitchen. Moist mucosa without lesions . Neck supple and nontender. No palpable supraclavicular or cervical adenopathy. Normal sized without goiter. Respiratory WNL. No retractions.. Cardiovascular Pedal Pulses WNL. No clubbing, cyanosis or edema. Integumentary (Hair, Skin) the significant excoriation is completely healed. He just has a area of pressure ulceration stage III on the left gluteal region which has to be debrided.. No crepitus or fluctuance. No peri-wound warmth or erythema. No masses.Marland Kitchen. Psychiatric Judgement and insight Intact.. No evidence of depression, anxiety, or agitation.. Electronic Signature(s) Signed: 06/17/2014 12:26:09 PM By: Evlyn KannerBritto, Calum Cormier MD, FACS Entered By: Evlyn KannerBritto, Bradey Luzier on 06/17/2014 12:00:36 James ErbENTON, Rayshard  ([id number]) -------------------------------------------------------------------------------- Physician Orders Details Patient Name: James ErbENTON, Duffy Date of Service: 06/17/2014 11:00 AM Medical Record Number: [id number] Patient Account Number: [id number] Date of Birth/Sex: 03/18/1933 36(80 y.o. Male) Treating RN: Curtis Sitesorthy, Joanna Primary Care Physician: Maudie FlakesFeldpausch, Dale Other Clinician: Referring Physician: Maudie FlakesFeldpausch, Dale Treating Physician/Extender: Rudene ReBritto, Yariana Hoaglund Weeks in Treatment: 1 Verbal / Phone Orders: Yes Clinician: Curtis Sitesorthy, Joanna Read Back and Verified: Yes Diagnosis Coding Wound Cleansing Wound #1 Left Gluteus o Clean wound with Normal Saline. o Cleanse wound with mild soap and water Anesthetic Wound #1 Left Gluteus o Topical Lidocaine 4% cream applied to wound bed prior to debridement Skin Barriers/Peri-Wound Care Wound #1 Left Gluteus o Skin Prep Primary Wound Dressing Wound #1 Left Gluteus o Aquacel Ag - or calcium alginate equivalent Secondary Dressing Wound #1 Left Gluteus o Boardered Foam Dressing Dressing Change Frequency Wound #1 Left Gluteus o Change dressing every other day. Follow-up Appointments Wound #1 Left Gluteus o Return Appointment in 1 week. Off-Loading Wound #1 Left Gluteus o Gel wheelchair cushion o Turn and reposition every 2 hours Leija, Kiaan ([id number]) Additional Orders / Instructions Wound #1 Left Gluteus o Increase protein intake. Home Health Wound #1 Left Gluteus o Continue Home Health Visits - [facility] o Home Health Nurse may visit PRN to address patientos wound care needs. o FACE TO FACE ENCOUNTER: MEDICARE and MEDICAID PATIENTS: I certify that this patient is under my care and that I had a face-to-face encounter that meets the physician face-to-face encounter requirements with this patient on this date. The encounter with the patient was in whole or in part for the following MEDICAL CONDITION:  (primary reason for Home Healthcare) MEDICAL NECESSITY: I certify, that based on my findings, NURSING services are a medically necessary home health service. HOME BOUND STATUS: I certify that my clinical findings support that this patient is homebound (i.e., Due to illness or injury, pt requires aid of supportive devices such as crutches, cane, wheelchairs, walkers, the use of special transportation or the assistance of another person to leave their place of residence. There is a normal inability to leave the home and doing so requires considerable and taxing effort. Other absences are for medical reasons / religious services and are infrequent or of short duration when for other reasons). o If current dressing causes regression in wound condition, may  D/C ordered dressing product/s and apply Normal Saline Moist Dressing daily until next Wound Healing Center / Other MD appointment. Notify Wound Healing Center of regression in wound condition at [phone]. o Please direct any NON-WOUND related issues/requests for orders to patient's Primary Care Physician Electronic Signature(s) Signed: 06/17/2014 12:26:09 PM By: Evlyn Kanner MD, FACS Signed: 06/17/2014 5:34:11 PM By: Curtis Sites Entered By: Curtis Sites on 06/17/2014 11:55:11 James [name] ([id number]) -------------------------------------------------------------------------------- Problem List Details Patient Name: James [name] Date of Service: 06/17/2014 11:00 AM Medical Record Number: [id number] Patient Account Number: [id number] Date of Birth/Sex: 10-Jan-1934 (81 y.o. Male) Treating RN: Primary Care Physician: Maudie Flakes Other Clinician: Referring Physician: Maudie Flakes Treating Physician/Extender: Rudene Re in Treatment: 1 Active Problems ICD-10 Encounter Code Description Active Date Diagnosis E11.622 Type 2 diabetes mellitus with other skin ulcer 06/10/2014 Yes L89.323 Pressure ulcer of left  buttock, stage 3 06/10/2014 Yes L89.313 Pressure ulcer of right buttock, stage 3 06/10/2014 Yes Inactive Problems Resolved Problems Electronic Signature(s) Signed: 06/17/2014 12:26:09 PM By: Evlyn Kanner MD, FACS Entered By: Evlyn Kanner on 06/17/2014 11:58:00 James [name] ([id number]) -------------------------------------------------------------------------------- Progress Note Details Patient Name: James [name] Date of Service: 06/17/2014 11:00 AM Medical Record Number: [id number] Patient Account Number: [id number] Date of Birth/Sex: 1933-04-07 (81 y.o. Male) Treating RN: Primary Care Physician: Maudie Flakes Other Clinician: Referring Physician: Maudie Flakes Treating Physician/Extender: Rudene Re in Treatment: 1 Subjective Chief Complaint Information obtained from Patient Patient presents to the wound care center for a consult due non healing wound. this 81 year old gentleman comes with bilateral gluteal ulcerations which he's had for about a month. History of Present Illness (HPI) The following HPI elements were documented for the patient's wound: Location: Patient presents with an ulcer on the right and left buttock Quality: Patient reports experiencing a dull pain to affected area(s). Severity: Patient states wound are getting worse. Duration: Patient has had the wound for > 3 months prior to seeking treatment at the wound center Timing: Pain in wound is Intermittent (comes and goes Context: The wound occurred when the patient had a fall and injured himself on his side but he was on his bottom and dragged himself for a while until he could get to a phone. Modifying Factors: he sits in a chair for about 18 hours a day and sleeps about 6 hours. Associated Signs and Symptoms: Patient reports having difficulty standing for long periods. very pleasant 81 year old gentleman whose had chronic morbidities including asthma, diabetes mellitus type 2, chronic disease,  COPD, hypertension, atrial fibrillation, SVT, CAD, cardiomyopathy, congestive heart failure. About a month ago he had a fall and during that time he had to drag himself on his bottom for a bit until he could get to a phone. He thinks some of this problem started at that time. His diabetes is diet controlled and he does not have to take any medications for a long while. His last hemoglobin A1c was 5.8. Was a former smoker and has not smoked since 1985. He is not taken any specific treatment for his present problems. 06/17/2014 -- he has been very diligent in offloading as much as possible through the entire week and this shows in the excellent healing he has achieved. Objective Constitutional Marken, Nadeem ([id number]) Pulse regular. Respirations normal and unlabored. Afebrile. Vitals Time Taken: 11:15 AM, Height: 75 in, Weight: 230 lbs, BMI: 28.7, Temperature: 97.5 F, Pulse: 76 bpm, Respiratory Rate: 17 breaths/min, Blood Pressure: 128/76 mmHg. Eyes Nonicteric. Reactive to light. Ears, Nose, Mouth, and Throat Lips,  teeth, and gums WNL.Marland Kitchen. Moist mucosa without lesions . Neck supple and nontender. No palpable supraclavicular or cervical adenopathy. Normal sized without goiter. Respiratory WNL. No retractions.. Cardiovascular Pedal Pulses WNL. No clubbing, cyanosis or edema. Psychiatric Judgement and insight Intact.. No evidence of depression, anxiety, or agitation.. Integumentary (Hair, Skin) the significant excoriation is completely healed. He just has a area of pressure ulceration stage III on the left gluteal region which has to be debrided.. No crepitus or fluctuance. No peri-wound warmth or erythema. No masses.. Wound #1 status is Open. Original cause of wound was Pressure Injury. The wound is located on the Left Gluteus. The wound measures 2.1cm length x 2.5cm width x 0.1cm depth; 4.123cm^2 area and 0.412cm^3 volume. Assessment Active Problems ICD-10 E11.622 - Type 2  diabetes mellitus with other skin ulcer L89.323 - Pressure ulcer of left buttock, stage 3 L89.313 - Pressure ulcer of right buttock, stage 3 Duguay, Jahaan ([id number]) The patient has made an excellent recovery having been offloading very diligently. The small area which now has a stage III pressure ulceration will be treated with Aquacel and appropriate offloading. Come back and see me next week. Procedures Wound #1 Wound #1 is a Pressure Ulcer located on the Left Gluteus . There was an Open Wound debridement with total area of 5.25 sq cm performed by Jalisha Enneking, Ignacia FellingErrol J., MD. with the following instrument(s): to remove Non- Viable tissue/material including Exudate, Fibrin/Slough, and Eschar after achieving pain control using Lidocaine 4% Topical Solution. A time out was conducted prior to the start of the procedure. A Minimum amount of bleeding was controlled with Pressure. The procedure was tolerated well with a pain level of 0 throughout and a pain level of 0 following the procedure. Post Debridement Measurements: 2.1cm length x 2.5cm width x 0.1cm depth; 0.412cm^3 volume. Post debridement Stage noted as Category/Stage III. Plan Wound Cleansing: Wound #1 Left Gluteus: Clean wound with Normal Saline. Cleanse wound with mild soap and water Anesthetic: Wound #1 Left Gluteus: Topical Lidocaine 4% cream applied to wound bed prior to debridement Skin Barriers/Peri-Wound Care: Wound #1 Left Gluteus: Skin Prep Primary Wound Dressing: Wound #1 Left Gluteus: Aquacel Ag - or calcium alginate equivalent Secondary Dressing: Wound #1 Left Gluteus: Boardered Foam Dressing Dressing Change Frequency: Wound #1 Left Gluteus: Change dressing every other day. Follow-up Appointments: Wound #1 Left Gluteus: Return Appointment in 1 week. Off-Loading: Wound #1 Left Gluteus: James ErbDENTON, Jon ([id number]) Gel wheelchair cushion Turn and reposition every 2 hours Additional Orders /  Instructions: Wound #1 Left Gluteus: Increase protein intake. Home Health: Wound #1 Left Gluteus: [facility] Nurse may visit PRN to address patient s wound care needs. FACE TO FACE ENCOUNTER: MEDICARE and MEDICAID PATIENTS: I certify that this patient is under my care and that I had a face-to-face encounter that meets the physician face-to-face encounter requirements with this patient on this date. The encounter with the patient was in whole or in part for the following MEDICAL CONDITION: (primary reason for Home Healthcare) MEDICAL NECESSITY: I certify, that based on my findings, NURSING services are a medically necessary home health service. HOME BOUND STATUS: I certify that my clinical findings support that this patient is homebound (i.e., Due to illness or injury, pt requires aid of supportive devices such as crutches, cane, wheelchairs, walkers, the use of special transportation or the assistance of another person to leave their place of residence. There is a normal inability to leave the home and doing so requires  considerable and taxing effort. Other absences are for medical reasons / religious services and are infrequent or of short duration when for other reasons). If current dressing causes regression in wound condition, may D/C ordered dressing product/s and apply Normal Saline Moist Dressing daily until next Wound Healing Center / Other MD appointment. Notify Wound Healing Center of regression in wound condition at [phone]. Please direct any NON-WOUND related issues/requests for orders to patient's Primary Care Physician The patient has made an excellent recovery having been offloading very diligently. The small area which now has a stage III pressure ulceration will be treated with Aquacel and appropriate offloading. Come back and see me next week. Electronic Signature(s) Signed: 06/21/2014 1:04:49 PM By: Evlyn Kanner MD,  FACS Previous Signature: 06/17/2014 12:26:09 PM Version By: Evlyn Kanner MD, FACS Entered By: Evlyn Kanner on 06/21/2014 13:00:53 James [name] ([id number]) -------------------------------------------------------------------------------- SuperBill Details Patient Name: James [name] Date of Service: 06/17/2014 Medical Record Number: [id number] Patient Account Number: [id number] Date of Birth/Sex: 12/29/1933 (81 y.o. Male) Treating RN: Primary Care Physician: Maudie Flakes Other Clinician: Referring Physician: Maudie Flakes Treating Physician/Extender: Rudene Re in Treatment: 1 Diagnosis Coding ICD-10 Codes Code Description [phone] Type 2 diabetes mellitus with other skin ulcer L89.323 Pressure ulcer of left buttock, stage 3 L89.313 Pressure ulcer of right buttock, stage 3 Facility Procedures CPT4 Code: [id number] Description: 97597 - DEBRIDE WOUND 1ST 20 SQ CM OR < ICD-10 Description Diagnosis E11.622 Type 2 diabetes mellitus with other skin ulcer L89.323 Pressure ulcer of left buttock, stage 3 Modifier: Quantity: 1 Physician Procedures CPT4 Code: [id number] Description: 97597 - WC PHYS DEBR WO ANESTH 20 SQ CM ICD-10 Description Diagnosis E11.622 Type 2 diabetes mellitus with other skin ulcer L89.323 Pressure ulcer of left buttock, stage 3 Modifier: Quantity: 1 Electronic Signature(s) Signed: 06/17/2014 12:26:09 PM By: Evlyn Kanner MD, FACS Entered By: Evlyn Kanner on 06/17/2014 12:03:36

## 2014-06-18 NOTE — Progress Notes (Signed)
James [name], James [name] ([id number]) Visit Report for 06/17/2014 Arrival Information Details Patient Name: James [name], James [name] Date of Service: 06/17/2014 11:00 AM Medical Record Number: [id number] Patient Account Number: [id number] Date of Birth/Sex: 01/27/1934 [age] y.o. Male) Treating RN: James MealyAfful, RN, BSN, James [name] Primary Care Physician: James [name], James [name] Other Clinician: Referring Physician: Maudie [name], James [name] Treating Physician/Extender: James [name], James [name] Weeks in Treatment: 1 Visit Information History Since Last Visit Any new allergies or adverse reactions: No Patient Arrived: Wheel Chair Had a fall or experienced change in No activities of daily living that may affect Arrival Time: 11:15 risk of falls: Accompanied By: wife Signs or symptoms of abuse/neglect since last No Transfer Assistance: None visito Patient Identification Verified: Yes Hospitalized since last visit: No Secondary Verification Process Yes Has Dressing in Place as Prescribed: Yes Completed: Pain Present Now: No Patient Requires Transmission-Based No Precautions: Patient Has Alerts: No Electronic Signature(s) Signed: 06/17/2014 1:56:59 PM By: James [name], James [name] BSN, RN Entered By: James [name], James [name] on 06/17/2014 11:16:19 James [name], James [name] ([id number]) -------------------------------------------------------------------------------- Encounter Discharge Information Details Patient Name: James [name], James [name] Date of Service: 06/17/2014 11:00 AM Medical Record Number: [id number] Patient Account Number: [id number] Date of Birth/Sex: 02/06/1933 [age] y.o. Male) Treating RN: James MealyAfful, RN, BSN, James [name] Primary Care Physician: James [name], James [name] Other Clinician: Referring Physician: Maudie [name], James [name] Treating Physician/Extender: James [name], James [name] Weeks in Treatment: 1 Encounter Discharge Information Items Discharge Pain Level: 0 Discharge Condition: Stable Ambulatory Status: Ambulatory Discharge Destination: Home Transportation: Private Auto Accompanied By:  wife Schedule Follow-up Appointment: No Medication Reconciliation completed and provided to Patient/Care No James [name]: Provided on Clinical Summary of Care: 06/17/2014 Form Type Recipient Paper Patient MD Electronic Signature(s) Signed: 06/17/2014 12:05:10 PM By: James [name], James [name] Entered By: James [name], James [name] on 06/17/2014 12:05:10 James [name], James [name] ([id number]) -------------------------------------------------------------------------------- Lower Extremity Assessment Details Patient Name: James [name], James [name] Date of Service: 06/17/2014 11:00 AM Medical Record Number: [id number] Patient Account Number: [id number] Date of Birth/Sex: 03/21/1933 [age] y.o. Male) Treating RN: James MealyAfful, RN, BSN, James [name] Primary Care Physician: James [name], James [name] Other Clinician: Referring Physician: Maudie [name], James [name] Treating Physician/Extender: James [name], James [name] Weeks in Treatment: 1 Electronic Signature(s) Signed: 06/17/2014 1:56:59 PM By: James [name], James [name] BSN, RN Entered By: James [name], James [name] on 06/17/2014 11:17:42 James [name], Bhavya ([id number]) -------------------------------------------------------------------------------- Multi Wound Chart Details Patient Name: James [name], James [name] Date of Service: 06/17/2014 11:00 AM Medical Record Number: [id number] Patient Account Number: [id number] Date of Birth/Sex: 11/10/1933 [age] y.o. Male) Treating RN: James [name], James [name] Primary Care Physician: James [name], James [name] Other Clinician: Referring Physician: Maudie [name], James [name] Treating Physician/Extender: James [name], James [name] Weeks in Treatment: 1 Vital Signs Height(in): 75 Pulse(bpm): 76 Weight(lbs): 230 Blood Pressure 128/76 (mmHg): Body Mass Index(BMI): 29 Temperature(F): 97.5 Respiratory Rate 17 (breaths/min): Photos: [1:No Photos] [James [name]:James [name]] Wound Location: [1:Right Gluteus] [James [name]:James [name]] Wounding Event: [1:Pressure Injury] [James [name]:James [name]] Primary Etiology: [1:Pressure Ulcer] [James [name]:James [name]] Date Acquired: [1:05/11/2014] [James [name]:James [name]] Weeks of Treatment: [1:1] [James [name]:James [name]] Wound  Status: [1:Open] [James [name]:James [name]] Measurements L x W x D 2.1x2.5x0.1 [James [name]:James [name]] (cm) Area (cm) : [1:4.123] [James [name]:James [name]] Volume (cm) : [1:0.412] [James [name]:James [name]] % Reduction in Area: [1:95.10%] [James [name]:James [name]] % Reduction in Volume: 95.10% [James [name]:James [name]] Classification: [1:Category/Stage III] [James [name]:James [name]] Periwound Skin Texture: No Abnormalities Noted [James [name]:James [name]] Periwound Skin [1:No Abnormalities Noted] [James [name]:James [name]] Moisture: Periwound Skin Color: No Abnormalities Noted [James [name]:James [name]] Tenderness on [1:No] [James [name]:James [name]] Treatment Notes Electronic Signature(s) Signed: 06/17/2014 5:34:11 PM By: James [name], James [name] Entered By: James [name], James [name] on 06/17/2014 11:52:27 James [name], James [name] ([id number]) -------------------------------------------------------------------------------- Multi-Disciplinary Care Plan Details Patient Name: James [name], Aws Date of Service: 06/17/2014 11:00 AM Medical Record Number: [id number] Patient Account Number: [id number] Date of Birth/Sex: 07/20/1933 [age] y.o. Male) Treating RN: James [name], James [name] Primary Care Physician: James [name], James [name] Other Clinician: Referring Physician:  Maudie [name], James [name] Treating Physician/Extender: James [name], James [name] Weeks in Treatment: 1 Active Inactive Abuse / Safety / Falls / Self Care Management Nursing Diagnoses: Potential for falls Goals: Patient will remain injury free Date Initiated: 06/10/2014 Goal Status: Active Interventions: Assess fall risk on admission and as needed Notes: Nutrition Nursing Diagnoses: Potential for alteratiion in Nutrition/Potential for imbalanced nutrition Goals: Patient/caregiver agrees to and verbalizes understanding of need to use nutritional supplements and/or vitamins as prescribed Date Initiated: 06/10/2014 Goal Status: Active Interventions: Assess patient nutrition upon admission and as needed per policy Notes: Orientation to the Wound Care Program Nursing Diagnoses: Knowledge deficit related to the wound healing center  program Goals: Patient/caregiver will verbalize understanding of the Wound Healing Center Program James [name], James [name] ([id number]) Date Initiated: 06/10/2014 Goal Status: Active Interventions: Provide education on orientation to the wound center Notes: Pressure Nursing Diagnoses: Potential for impaired tissue integrity related to pressure, friction, moisture, and shear Goals: Patient will remain free of pressure ulcers Date Initiated: 06/10/2014 Goal Status: Active Interventions: Assess potential for pressure ulcer upon admission and as needed Notes: Wound/Skin Impairment Nursing Diagnoses: Knowledge deficit related to ulceration/compromised skin integrity Goals: Ulcer/skin breakdown will have a volume reduction of 30% by week 4 Date Initiated: 06/10/2014 Goal Status: Active Interventions: Assess ulceration(s) every visit Notes: Electronic Signature(s) Signed: 06/17/2014 5:34:11 PM By: James [name], James [name] Entered By: James [name], James [name] on 06/17/2014 11:51:44 James [name], James [name] ([id number]) -------------------------------------------------------------------------------- Pain Assessment Details Patient Name: James [name], James [name] Date of Service: 06/17/2014 11:00 AM Medical Record Number: [id number] Patient Account Number: [id number] Date of Birth/Sex: 03/09/1933 [age] y.o. Male) Treating RN: James MealyAfful, RN, BSN, James [name] Primary Care Physician: James [name], James [name] Other Clinician: Referring Physician: Maudie [name], James [name] Treating Physician/Extender: James [name], James [name] Weeks in Treatment: 1 Active Problems Location of Pain Severity and Description of Pain Patient Has Paino No Site Locations Pain Management and Medication Current Pain Management: Electronic Signature(s) Signed: 06/17/2014 1:56:59 PM By: James [name], James [name] BSN, RN Entered By: James [name], James [name] on 06/17/2014 11:16:31 James [name], Demarrion ([id number]) -------------------------------------------------------------------------------- Patient/Caregiver Education Details Patient  Name: James [name], Avier Date of Service: 06/17/2014 11:00 AM Medical Record Number: [id number] Patient Account Number: [id number] Date of Birth/Gender: 08/04/1933 [age] y.o. Male) Treating RN: James [name], James [name] Primary Care Physician: James [name], James [name] Other Clinician: Referring Physician: Maudie [name], James [name] Treating Physician/Extender: James [name], James [name] Weeks in Treatment: 1 Education Assessment Education Provided To: Patient and Caregiver Education Topics Provided Pressure: Handouts: Pressure Ulcers: Care and Offloading Methods: Demonstration, Explain/Verbal Responses: State content correctly Wound/Skin Impairment: Handouts: Caring for Your Ulcer, Skin Care Do's and Dont's Methods: Demonstration, Explain/Verbal Responses: State content correctly Electronic Signature(s) Signed: 06/17/2014 5:34:11 PM By: James [name], James [name] Entered By: James [name], James [name] on 06/17/2014 11:53:02 James [name], Kyser ([id number]) -------------------------------------------------------------------------------- Wound Assessment Details Patient Name: James [name], Blanca Date of Service: 06/17/2014 11:00 AM Medical Record Number: [id number] Patient Account Number: [id number] Date of Birth/Sex: 05/16/1933 [age] y.o. Male) Treating RN: James MealyAfful, RN, BSN, James [name] Primary Care Physician: James [name], James [name] Other Clinician: Referring Physician: Maudie [name], James [name] Treating Physician/Extender: James [name], James [name] Weeks in Treatment: 1 Wound Status Wound Number: 1 Primary Etiology: Pressure Ulcer Wound Location: Right Gluteus Wound Status: Open Wounding Event: Pressure Injury Date Acquired: 05/11/2014 Weeks Of Treatment: 1 Clustered Wound: No Photos Photo Uploaded By: James [name], James [name] on 06/17/2014 16:58:37 Wound Measurements Length: (cm) 2.1 Width: (cm) 2.5 Depth: (cm) 0.1 Area: (cm) 4.123 Volume: (cm) 0.412 % Reduction in Area: 95.1% % Reduction in Volume: 95.1% Wound Description Classification: Category/Stage III Periwound Skin Texture Texture  Color No Abnormalities Noted: No No Abnormalities Noted: No Moisture No Abnormalities Noted: No Electronic Signature(s) Signed: 06/17/2014  1:56:59 PM By: James Eric BSN, RN Beatty, Judie Petit ([id number]) Entered By: James Eric on 06/17/2014 11:24:12 James Erb ([id number]) -------------------------------------------------------------------------------- Vitals Details Patient Name: James Erb Date of Service: 06/17/2014 11:00 AM Medical Record Number: [id number] Patient Account Number: [id number] Date of Birth/Sex: [birth year] (81 y.o. Male) Treating RN: Afful, RN, BSN, James [name] Primary Care Physician: James Flakes Other Clinician: Referring Physician: Maudie Flakes Treating Physician/Extender: James Re in Treatment: 1 Vital Signs Time Taken: 11:15 Temperature (F): 97.5 Height (in): 75 Pulse (bpm): 76 Weight (lbs): 230 Respiratory Rate (breaths/min): 17 Body Mass Index (BMI): 28.7 Blood Pressure (mmHg): 128/76 Reference Range: 80 - 120 mg / dl Electronic Signature(s) Signed: 06/17/2014 1:56:59 PM By: James Eric BSN, RN Entered By: James Eric on 06/17/2014 11:46:44

## 2014-06-24 ENCOUNTER — Encounter: Payer: Medicare Other | Admitting: Surgery

## 2014-06-24 DIAGNOSIS — L89313 Pressure ulcer of right buttock, stage 3: Secondary | ICD-10-CM | POA: Diagnosis not present

## 2014-06-24 NOTE — Progress Notes (Addendum)
MICHOEL, KUNIN ([id number]) Visit Report for 06/24/2014 Chief Complaint Document Details Patient Name: James [name], James [name] Date of Service: 06/24/2014 11:30 AM Medical Record Number: [id number] Patient Account Number: [id number] Date of Birth/Sex: 06/03/1933 (81 y.o. Male) Treating RN: Primary Care Physician: Maudie Flakes Other Clinician: Referring Physician: Maudie Flakes Treating Physician/Extender: Rudene Re in Treatment: 2 Information Obtained from: Patient Chief Complaint Patient presents to the wound care center for a consult due non healing wound. this 81 year old gentleman comes with bilateral gluteal ulcerations which he's had for about a month. Electronic Signature(s) Signed: 06/24/2014 12:32:56 PM By: Evlyn Kanner MD, FACS Entered By: Evlyn Kanner on 06/24/2014 12:18:13 James [name] ([id number]) -------------------------------------------------------------------------------- HPI Details Patient Name: James [name] Date of Service: 06/24/2014 11:30 AM Medical Record Number: [id number] Patient Account Number: [id number] Date of Birth/Sex: 07/14/1933 (81 y.o. Male) Treating RN: Primary Care Physician: Maudie Flakes Other Clinician: Referring Physician: Maudie Flakes Treating Physician/Extender: Rudene Re in Treatment: 2 History of Present Illness Location: Patient presents with an ulcer on the right and left buttock Quality: Patient reports experiencing a dull pain to affected area(s). Severity: Patient states wound are getting worse. Duration: Patient has had the wound for > 3 months prior to seeking treatment at the wound center Timing: Pain in wound is Intermittent (comes and goes Context: The wound occurred when the patient had a fall and injured himself on his side but he was on his bottom and dragged himself for a while until he could get to a phone. Modifying Factors: he sits in a chair for about 18 hours a day and sleeps about 6  hours. Associated Signs and Symptoms: Patient reports having difficulty standing for long periods. HPI Description: very pleasant 81 year old gentleman whose had chronic morbidities including asthma, diabetes mellitus type 2, chronic disease, COPD, hypertension, atrial fibrillation, SVT, CAD, cardiomyopathy, congestive heart failure. About a month ago he had a fall and during that time he had to drag himself on his bottom for a bit until he could get to a phone. He thinks some of this problem started at that time. His diabetes is diet controlled and he does not have to take any medications for a long while. His last hemoglobin A1c was 5.8. Was a former smoker and has not smoked since 1985. He is not taken any specific treatment for his present problems. 06/17/2014 -- he has been very diligent in offloading as much as possible through the entire week and this shows in the excellent healing he has achieved. 06/24/2014 -- he is working with the windows to get his air mattress and his questions for the wheelchair. We have discussed the benefits and alternatives of this method of offloading in great detail. Electronic Signature(s) Signed: 06/24/2014 12:32:56 PM By: Evlyn Kanner MD, FACS Entered By: Evlyn Kanner on 06/24/2014 12:18:57 James [name] ([id number]) -------------------------------------------------------------------------------- Physical Exam Details Patient Name: James [name] Date of Service: 06/24/2014 11:30 AM Medical Record Number: [id number] Patient Account Number: [id number] Date of Birth/Sex: [birth year] (81 y.o. Male) Treating RN: Primary Care Physician: Maudie Flakes Other Clinician: Referring Physician: Maudie Flakes Treating Physician/Extender: Rudene Re in Treatment: 2 Constitutional . Pulse regular. Respirations normal and unlabored. Afebrile. . Eyes Nonicteric. Reactive to light. Ears, Nose, Mouth, and Throat Lips, teeth, and gums WNL.Marland Kitchen Moist  mucosa without lesions . Neck supple and nontender. No palpable supraclavicular or cervical adenopathy. Normal sized without goiter. Respiratory WNL. No retractions.. Cardiovascular Pedal Pulses WNL. No clubbing, cyanosis or edema. Integumentary (Hair, Skin) His progress continues and he is  being offloading well. The ulcerations are now looking better and a lot of the excoriation has resolved.. No crepitus or fluctuance. No peri-wound warmth or erythema. No masses.Marland Kitchen. Psychiatric Judgement and insight Intact.. No evidence of depression, anxiety, or agitation.. Electronic Signature(s) Signed: 06/24/2014 12:32:56 PM By: Evlyn KannerBritto, Elliot Simoneaux MD, FACS Entered By: Evlyn KannerBritto, Nalah Macioce on 06/24/2014 12:20:00 James [name], James [name] ([id number]) -------------------------------------------------------------------------------- Physician Orders Details Patient Name: James [name], James [name] Date of Service: 06/24/2014 11:30 AM Medical Record Number: [id number] Patient Account Number: [id number] Date of Birth/Sex: 06/16/1933 [age] y.o. Male) Treating RN: Curtis Sitesorthy, Joanna Primary Care Physician: Maudie FlakesFeldpausch, Dale Other Clinician: Referring Physician: Maudie FlakesFeldpausch, Dale Treating Physician/Extender: Rudene ReBritto, Hiep Ollis Weeks in Treatment: 2 Verbal / Phone Orders: Yes Clinician: Curtis Sitesorthy, Joanna Read Back and Verified: Yes Diagnosis Coding Wound Cleansing Wound #1 Left Gluteus o Clean wound with Normal Saline. o Cleanse wound with mild soap and water Wound #2 Right Gluteus o Clean wound with Normal Saline. o Cleanse wound with mild soap and water Anesthetic Wound #1 Left Gluteus o Topical Lidocaine 4% cream applied to wound bed prior to debridement Wound #2 Right Gluteus o Topical Lidocaine 4% cream applied to wound bed prior to debridement Skin Barriers/Peri-Wound Care Wound #1 Left Gluteus o Skin Prep Wound #2 Right Gluteus o Skin Prep Primary Wound Dressing Wound #1 Left Gluteus o Aquacel - or calcium alginate  equivalent Wound #2 Right Gluteus o Aquacel - or calcium alginate equivalent Secondary Dressing Wound #1 Left Gluteus o Boardered Foam Dressing Wound #2 Right Gluteus o Boardered Foam Dressing James [name], James [name] ([id number]) Dressing Change Frequency Wound #1 Left Gluteus o Change dressing every other day. Wound #2 Right Gluteus o Change dressing every other day. Follow-up Appointments Wound #1 Left Gluteus o Return Appointment in 1 week. Wound #2 Right Gluteus o Return Appointment in 1 week. Off-Loading Wound #1 Left Gluteus o Gel wheelchair cushion o Turn and reposition every 2 hours Wound #2 Right Gluteus o Gel wheelchair cushion o Turn and reposition every 2 hours Additional Orders / Instructions Wound #1 Left Gluteus o Increase protein intake. Wound #2 Right Gluteus o Increase protein intake. Home Health Wound #1 Left Gluteus o Continue Home Health Visits - [facility] o Home Health Nurse may visit PRN to address patientos wound care needs. o FACE TO FACE ENCOUNTER: MEDICARE and MEDICAID PATIENTS: I certify that this patient is under my care and that I had a face-to-face encounter that meets the physician face-to-face encounter requirements with this patient on this date. The encounter with the patient was in whole or in part for the following MEDICAL CONDITION: (primary reason for Home Healthcare) MEDICAL NECESSITY: I certify, that based on my findings, NURSING services are a medically necessary home health service. HOME BOUND STATUS: I certify that my clinical findings support that this patient is homebound (i.e., Due to illness or injury, pt requires aid of supportive devices such as crutches, cane, wheelchairs, walkers, the use of special transportation or the assistance of another person to leave their place of residence. There is a normal inability to leave the home and doing so requires considerable and taxing effort. Other absences  are for medical reasons / religious services and are infrequent or of short duration when for other reasons). James ErbDENTON, James [name] ([id number]) o If current dressing causes regression in wound condition, may D/C ordered dressing product/s and apply Normal Saline Moist Dressing daily until next Wound Healing Center / Other MD appointment. Notify Wound Healing Center of regression in wound condition at [phone]. o Please direct any NON-WOUND related  issues/requests for orders to patient's Primary Care Physician Wound #2 Right Gluteus o Continue Home Health Visits - [facility] o Home Health Nurse may visit PRN to address patientos wound care needs. o FACE TO FACE ENCOUNTER: MEDICARE and MEDICAID PATIENTS: I certify that this patient is under my care and that I had a face-to-face encounter that meets the physician face-to-face encounter requirements with this patient on this date. The encounter with the patient was in whole or in part for the following MEDICAL CONDITION: (primary reason for Home Healthcare) MEDICAL NECESSITY: I certify, that based on my findings, NURSING services are a medically necessary home health service. HOME BOUND STATUS: I certify that my clinical findings support that this patient is homebound (i.e., Due to illness or injury, pt requires aid of supportive devices such as crutches, cane, wheelchairs, walkers, the use of special transportation or the assistance of another person to leave their place of residence. There is a normal inability to leave the home and doing so requires considerable and taxing effort. Other absences are for medical reasons / religious services and are infrequent or of short duration when for other reasons). o If current dressing causes regression in wound condition, may D/C ordered dressing product/s and apply Normal Saline Moist Dressing daily until next Wound Healing Center / Other MD appointment. Notify Wound Healing Center of  regression in wound condition at [phone]. o Please direct any NON-WOUND related issues/requests for orders to patient's Primary Care Physician Electronic Signature(s) Signed: 06/24/2014 1:58:34 PM By: Curtis Sites Signed: 06/24/2014 1:59:58 PM By: Evlyn Kanner MD, FACS Previous Signature: 06/24/2014 12:32:56 PM Version By: Evlyn Kanner MD, FACS Entered By: Curtis Sites on 06/24/2014 13:58:33 James [name] ([id number]) -------------------------------------------------------------------------------- Problem List Details Patient Name: James [name] Date of Service: 06/24/2014 11:30 AM Medical Record Number: [id number] Patient Account Number: [id number] Date of Birth/Sex: 09/13/1933 (81 y.o. Male) Treating RN: Primary Care Physician: Maudie Flakes Other Clinician: Referring Physician: Maudie Flakes Treating Physician/Extender: Rudene Re in Treatment: 2 Active Problems ICD-10 Encounter Code Description Active Date Diagnosis E11.622 Type 2 diabetes mellitus with other skin ulcer 06/10/2014 Yes L89.323 Pressure ulcer of left buttock, stage 3 06/10/2014 Yes L89.313 Pressure ulcer of right buttock, stage 3 06/10/2014 Yes Inactive Problems Resolved Problems Electronic Signature(s) Signed: 06/24/2014 12:32:56 PM By: Evlyn Kanner MD, FACS Entered By: Evlyn Kanner on 06/24/2014 12:18:05 James [name] ([id number]) -------------------------------------------------------------------------------- Progress Note Details Patient Name: James [name] Date of Service: 06/24/2014 11:30 AM Medical Record Number: [id number] Patient Account Number: [id number] Date of Birth/Sex: [birth year] (81 y.o. Male) Treating RN: Primary Care Physician: Maudie Flakes Other Clinician: Referring Physician: Maudie Flakes Treating Physician/Extender: Rudene Re in Treatment: 2 Subjective Chief Complaint Information obtained from Patient Patient presents to the wound care  center for a consult due non healing wound. this 81 year old gentleman comes with bilateral gluteal ulcerations which he's had for about a month. History of Present Illness (HPI) The following HPI elements were documented for the patient's wound: Location: Patient presents with an ulcer on the right and left buttock Quality: Patient reports experiencing a dull pain to affected area(s). Severity: Patient states wound are getting worse. Duration: Patient has had the wound for > 3 months prior to seeking treatment at the wound center Timing: Pain in wound is Intermittent (comes and goes Context: The wound occurred when the patient had a fall and injured himself on his side but he was on his bottom and dragged himself for a while until he could get to a phone. Modifying  Factors: he sits in a chair for about 18 hours a day and sleeps about 6 hours. Associated Signs and Symptoms: Patient reports having difficulty standing for long periods. very pleasant 81 year old gentleman whose had chronic morbidities including asthma, diabetes mellitus type 2, chronic disease, COPD, hypertension, atrial fibrillation, SVT, CAD, cardiomyopathy, congestive heart failure. About a month ago he had a fall and during that time he had to drag himself on his bottom for a bit until he could get to a phone. He thinks some of this problem started at that time. His diabetes is diet controlled and he does not have to take any medications for a long while. His last hemoglobin A1c was 5.8. Was a former smoker and has not smoked since 1985. He is not taken any specific treatment for his present problems. 06/17/2014 -- he has been very diligent in offloading as much as possible through the entire week and this shows in the excellent healing he has achieved. 06/24/2014 -- he is working with the windows to get his air mattress and his questions for the wheelchair. We have discussed the benefits and alternatives of this method of  offloading in great detail. James [name], James [name] ([id number]) Objective Constitutional Pulse regular. Respirations normal and unlabored. Afebrile. Vitals Time Taken: 11:45 AM, Height: 75 in, Weight: 230 lbs, BMI: 28.7, Temperature: 97.3 F, Pulse: 72 bpm, Respiratory Rate: 20 breaths/min, Blood Pressure: 122/100 mmHg. Eyes Nonicteric. Reactive to light. Ears, Nose, Mouth, and Throat Lips, teeth, and gums WNL.Marland Kitchen Moist mucosa without lesions . Neck supple and nontender. No palpable supraclavicular or cervical adenopathy. Normal sized without goiter. Respiratory WNL. No retractions.. Cardiovascular Pedal Pulses WNL. No clubbing, cyanosis or edema. Psychiatric Judgement and insight Intact.. No evidence of depression, anxiety, or agitation.. Integumentary (Hair, Skin) His progress continues and he is being offloading well. The ulcerations are now looking better and a lot of the excoriation has resolved.. No crepitus or fluctuance. No peri-wound warmth or erythema. No masses.. Wound #1 status is Open. Original cause of wound was Pressure Injury. The wound is located on the Left Gluteus. The wound measures 3.5cm length x 1.2cm width x 0.1cm depth; 3.299cm^2 area and 0.33cm^3 volume. The wound is limited to skin breakdown. There is no tunneling or undermining noted. The wound margin is indistinct and nonvisible. There is small (1-33%) granulation within the wound bed. There is no necrotic tissue within the wound bed. The periwound skin appearance exhibited: Moist. The periwound skin appearance did not exhibit: Callus, Crepitus, Excoriation, Fluctuance, Friable, Induration, Localized Edema, Rash, Scarring, Dry/Scaly, Maceration, Atrophie Blanche, Cyanosis, Ecchymosis, Hemosiderin Staining, Mottled, Pallor, Rubor, Erythema. Wound #2 status is Open. Original cause of wound was Pressure Injury. The wound is located on the Right Gluteus. The wound measures 3.6cm length x 2cm width x 0.1cm depth;  5.655cm^2 area and 0.565cm^3 volume. The wound is limited to skin breakdown. There is no tunneling or undermining noted. There is a medium amount of serous drainage noted. The wound margin is indistinct and nonvisible. There is small (1-33%) granulation within the wound bed. There is no necrotic tissue within the wound bed. The periwound skin appearance did not exhibit: Callus, Crepitus, Excoriation, Fluctuance, Friable, Induration, Localized Edema, Rash, Scarring, Dry/Scaly, Maceration, Moist, Atrophie Blanche, Cyanosis, Ecchymosis, James [name], James [name] ([id number]) Hemosiderin Staining, Mottled, Pallor, Rubor, Erythema. Periwound temperature was noted as No Abnormality. His progress continues and he is being offloading well. The ulcerations are now looking better and a lot of the excoriation has resolved. Assessment Active Problems ICD-10 E11.622 -  Type 2 diabetes mellitus with other skin ulcer L89.323 - Pressure ulcer of left buttock, stage 3 L89.313 - Pressure ulcer of right buttock, stage 3 We will continue to use silver alginate over the wounds and a large bordered foam. There again spent quite a bit of time discussing the offloading methods and all questions have been answered. We'll see him back next week. Plan Wound Cleansing: Wound #1 Left Gluteus: Clean wound with Normal Saline. Cleanse wound with mild soap and water Wound #2 Right Gluteus: Clean wound with Normal Saline. Cleanse wound with mild soap and water Anesthetic: Wound #1 Left Gluteus: Topical Lidocaine 4% cream applied to wound bed prior to debridement Wound #2 Right Gluteus: Topical Lidocaine 4% cream applied to wound bed prior to debridement Skin Barriers/Peri-Wound Care: Wound #1 Left Gluteus: Skin Prep Wound #2 Right Gluteus: Skin Prep Primary Wound Dressing: James [name], James [name] ([id number]) Wound #1 Left Gluteus: Aquacel - or calcium alginate equivalent Wound #2 Right Gluteus: Aquacel - or calcium alginate  equivalent Secondary Dressing: Wound #1 Left Gluteus: Boardered Foam Dressing Wound #2 Right Gluteus: Boardered Foam Dressing Dressing Change Frequency: Wound #1 Left Gluteus: Change dressing every other day. Wound #2 Right Gluteus: Change dressing every other day. Follow-up Appointments: Wound #1 Left Gluteus: Return Appointment in 1 week. Wound #2 Right Gluteus: Return Appointment in 1 week. Off-Loading: Wound #1 Left Gluteus: Gel wheelchair cushion Turn and reposition every 2 hours Wound #2 Right Gluteus: Gel wheelchair cushion Turn and reposition every 2 hours Additional Orders / Instructions: Wound #1 Left Gluteus: Increase protein intake. Wound #2 Right Gluteus: Increase protein intake. Home Health: Wound #1 Left Gluteus: Continue [facility] Health Visits - [facility] Nurse may visit PRN to address patient s wound care needs. FACE TO FACE ENCOUNTER: MEDICARE and MEDICAID PATIENTS: I certify that this patient is under my care and that I had a face-to-face encounter that meets the physician face-to-face encounter requirements with this patient on this date. The encounter with the patient was in whole or in part for the following MEDICAL CONDITION: (primary reason for Home Healthcare) MEDICAL NECESSITY: I certify, that based on my findings, NURSING services are a medically necessary home health service. HOME BOUND STATUS: I certify that my clinical findings support that this patient is homebound (i.e., Due to illness or injury, pt requires aid of supportive devices such as crutches, cane, wheelchairs, walkers, the use of special transportation or the assistance of another person to leave their place of residence. There is a normal inability to leave the home and doing so requires considerable and taxing effort. Other absences are for medical reasons / religious services and are infrequent or of short duration when for other reasons). If current dressing causes  regression in wound condition, may D/C ordered dressing product/s and apply Normal Saline Moist Dressing daily until next Wound Healing Center / Other MD appointment. Notify Wound Healing Center of regression in wound condition at [phone]. Please direct any NON-WOUND related issues/requests for orders to patient's Primary Care Physician Wound #2 Right Gluteus: Continue Home Health Visits - James [name], James [name] ([id number]) Home Health Nurse may visit PRN to address patient s wound care needs. FACE TO FACE ENCOUNTER: MEDICARE and MEDICAID PATIENTS: I certify that this patient is under my care and that I had a face-to-face encounter that meets the physician face-to-face encounter requirements with this patient on this date. The encounter with the patient was in whole or in part for the following MEDICAL CONDITION: (primary reason for Home  Healthcare) MEDICAL NECESSITY: I certify, that based on my findings, NURSING services are a medically necessary home health service. HOME BOUND STATUS: I certify that my clinical findings support that this patient is homebound (i.e., Due to illness or injury, pt requires aid of supportive devices such as crutches, cane, wheelchairs, walkers, the use of special transportation or the assistance of another person to leave their place of residence. There is a normal inability to leave the home and doing so requires considerable and taxing effort. Other absences are for medical reasons / religious services and are infrequent or of short duration when for other reasons). If current dressing causes regression in wound condition, may D/C ordered dressing product/s and apply Normal Saline Moist Dressing daily until next Wound Healing Center / Other MD appointment. Notify Wound Healing Center of regression in wound condition at [phone]. Please direct any NON-WOUND related issues/requests for orders to patient's Primary Care Physician We will continue to use  silver alginate over the wounds and a large bordered foam. There again spent quite a bit of time discussing the offloading methods and all questions have been answered. We'll see him back next week. Electronic Signature(s) Signed: 06/28/2014 1:03:07 PM By: Evlyn KannerBritto, Maida Widger MD, FACS Previous Signature: 06/24/2014 12:32:56 PM Version By: Evlyn KannerBritto, Katalia Choma MD, FACS Entered By: Evlyn KannerBritto, Diogo Anne on 06/28/2014 12:56:56 James [name], Shafin ([id number]) -------------------------------------------------------------------------------- SuperBill Details Patient Name: James [name], Bolton Date of Service: 06/24/2014 Medical Record Number: [id number] Patient Account Number: [id number] Date of Birth/Sex: 01/19/1934 [age] y.o. Male) Treating RN: Primary Care Physician: Maudie FlakesFeldpausch, Dale Other Clinician: Referring Physician: Maudie FlakesFeldpausch, Dale Treating Physician/Extender: Rudene ReBritto, Keyshawn Hellwig Weeks in Treatment: 2 Diagnosis Coding ICD-10 Codes Code Description [id number] Type 2 diabetes mellitus with other skin ulcer L89.323 Pressure ulcer of left buttock, stage 3 L89.313 Pressure ulcer of right buttock, stage 3 Facility Procedures CPT4 Code: [id number] Description: 99213 - WOUND CARE VISIT-LEV 3 EST PT Modifier: Quantity: 1 Physician Procedures CPT4 Code: [id number] Description: 99213 - WC PHYS LEVEL 3 - EST PT ICD-10 Description Diagnosis E11.622 Type 2 diabetes mellitus with other skin ulcer L89.323 Pressure ulcer of left buttock, stage 3 L89.313 Pressure ulcer of right buttock, stage 3 Modifier: Quantity: 1 Electronic Signature(s) Signed: 06/24/2014 12:32:56 PM By: Evlyn KannerBritto, Nara Paternoster MD, FACS Entered By: Evlyn KannerBritto, Zakhari Fogel on 06/24/2014 12:21:45

## 2014-06-25 NOTE — Progress Notes (Signed)
James [name] ([id number]) Visit Report for 06/24/2014 Arrival Information Details Patient Name: James [name], James [name] Date of Service: 06/24/2014 11:30 AM Medical Record Number: [id number] Patient Account Number: [id number] Date of Birth/Sex: May 20, 1933 (81 y.o. Male) Treating RN: James [name] Primary Care Physician: James [name] Other Clinician: Referring Physician: Maudie [name] Treating Physician/Extender: James [name] in Treatment: 2 Visit Information History Since Last Visit Added or deleted any medications: No Patient Arrived: James [name] Any new allergies or adverse reactions: No Arrival Time: 08:38 Had a fall or experienced change in No Accompanied By: wife activities of daily living that may affect Transfer Assistance: Manual risk of falls: Patient Identification Verified: Yes Signs or symptoms of abuse/neglect since last No Secondary Verification Process Completed: Yes visito Patient Requires Transmission-Based No Hospitalized since last visit: No Precautions: Has Dressing in Place as Prescribed: Yes Patient Has Alerts: No Pain Present Now: No Electronic Signature(s) Signed: 06/24/2014 4:39:41 PM By: James Gurney, RN, [name], James [name] Entered By: James Gurney, RN, [name], James on 06/24/2014 11:44:40 James [name] ([id number]) -------------------------------------------------------------------------------- Clinic Level of Care Assessment Details Patient Name: James [name] Date of Service: 06/24/2014 11:30 AM Medical Record Number: [id number] Patient Account Number: [id number] Date of Birth/Sex: 22-Jun-1933 (80 y.o. Male) Treating RN: James [name] Primary Care Physician: James [name] Other Clinician: Referring Physician: Maudie [name] Treating Physician/Extender: James [name] in Treatment: 2 Clinic Level of Care Assessment Items TOOL 4 Quantity Score  - Use when only an EandM is performed on FOLLOW-UP visit 0 ASSESSMENTS - Nursing Assessment / Reassessment X  - Reassessment of Co-morbidities (includes updates in patient status) 1 10 X - Reassessment of Adherence to Treatment Plan 1 5 ASSESSMENTS - Wound and Skin Assessment / Reassessment  - Simple Wound Assessment / Reassessment - one wound 0 X - Complex Wound Assessment / Reassessment - multiple wounds 2 5  - Dermatologic / Skin Assessment (not related to wound area) 0 ASSESSMENTS - Focused Assessment  - Circumferential Edema Measurements - multi extremities 0  - Nutritional Assessment / Counseling / Intervention 0  - Lower Extremity Assessment (monofilament, tuning fork, pulses) 0  - Peripheral Arterial Disease Assessment (using hand held doppler) 0 ASSESSMENTS - Ostomy and/or Continence Assessment and Care  - Incontinence Assessment and Management 0  - Ostomy Care Assessment and Management (repouching, etc.) 0 PROCESS - Coordination of Care X - Simple Patient / Family Education for ongoing care 1 15  - Complex (extensive) Patient / Family Education for ongoing care 0  - Staff obtains Chiropractor, Records, Test Results / Process Orders 0  - Staff telephones HHA, Nursing Homes / Clarify orders / etc 0  - Routine Transfer to another Facility (non-emergent condition) 0 James [name], James [name] ([id number])  - Routine Hospital Admission (non-emergent condition) 0  - New Admissions / Manufacturing engineer / Ordering NPWT, Apligraf, etc. 0  - Emergency Hospital Admission (emergent condition) 0 X - Simple Discharge Coordination 1 10  - Complex (extensive) Discharge Coordination 0 PROCESS - Special Needs  - Pediatric / Minor Patient Management 0  - Isolation Patient Management 0  - Hearing / Language / Visual special needs 0  - Assessment of Community assistance (transportation, D/C planning, etc.) 0  - Additional assistance / Altered mentation 0  - Support Surface(s) Assessment (bed, cushion, seat, etc.) 0 INTERVENTIONS - Wound Cleansing / Measurement  -  Simple Wound Cleansing - one wound 0 X - Complex Wound Cleansing - multiple wounds 2 5 X - Wound Imaging (photographs - any number of wounds) 1 5  -  Wound Tracing (instead of photographs) 0 []  - Simple Wound Measurement - one wound 0 X - Complex Wound Measurement - multiple wounds 2 5 INTERVENTIONS - Wound Dressings X - Small Wound Dressing one or multiple wounds 2 10 []  - Medium Wound Dressing one or multiple wounds 0 []  - Large Wound Dressing one or multiple wounds 0 []  - Application of Medications - topical 0 []  - Application of Medications - injection 0 INTERVENTIONS - Miscellaneous []  - External ear exam 0 James [name] ([id number]) []  - Specimen Collection (cultures, biopsies, blood, body fluids, etc.) 0 []  - Specimen(s) / Culture(s) sent or taken to Lab for analysis 0 []  - Patient Transfer (multiple staff / Michiel SitesHoyer Lift / Similar devices) 0 []  - Simple Staple / Suture removal (25 or less) 0 []  - Complex Staple / Suture removal (26 or more) 0 []  - Hypo / Hyperglycemic Management (close monitor of Blood Glucose) 0 []  - Ankle / Brachial Index (ABI) - do not check if billed separately 0 X - Vital Signs 1 5 Has the patient been seen at the hospital within the last three years: Yes Total Score: 100 Level Of Care: New/Established - Level 3 Electronic Signature(s) Signed: 06/24/2014 4:34:18 PM By: James Sitesorthy, Joanna Entered By: James Sitesorthy, Joanna on 06/24/2014 12:07:46 James [name] ([id number]) -------------------------------------------------------------------------------- Encounter Discharge Information Details Patient Name: James [name] Date of Service: 06/24/2014 11:30 AM Medical Record Number: [id number] Patient Account Number: [id number] Date of Birth/Sex: 10/03/1933 [age] y.o. Male) Treating RN: Primary Care Physician: James FlakesFeldpausch, Dale Other Clinician: Referring Physician: Maudie FlakesFeldpausch, Dale Treating Physician/Extender: James ReBritto, Errol Weeks in Treatment: 2 Encounter Discharge  Information Items Discharge Pain Level: 0 Discharge Condition: Stable Ambulatory Status: Ambulatory Discharge Destination: Home Transportation: Private Auto Accompanied By: wife Schedule Follow-up Appointment: Yes Medication Reconciliation completed and provided to Patient/Care Yes Envi Eagleson: Provided on Clinical Summary of Care: 06/24/2014 Form Type Recipient Paper Patient MD Electronic Signature(s) Signed: 06/24/2014 4:39:41 PM By: James GurneyWoody, RN, [name], James [name] Previous Signature: 06/24/2014 12:13:09 PM Version By: Gwenlyn PerkingMoore, Shelia Entered By: James GurneyWoody, RN, [name], James on 06/24/2014 12:13:27 James [name] ([id number]) -------------------------------------------------------------------------------- General Visit Notes Details Patient Name: James [name] Date of Service: 06/24/2014 11:30 AM Medical Record Number: [id number] Patient Account Number: [id number] Date of Birth/Sex: 01/26/1934 [age] y.o. Male) Treating RN: James Sitesorthy, Joanna Primary Care Physician: James FlakesFeldpausch, Dale Other Clinician: Referring Physician: Maudie FlakesFeldpausch, Dale Treating Physician/Extender: James ReBritto, Errol Weeks in Treatment: 2 Notes I spoke with patient and his wife during their visit today. Patient states that when support surfaces were brought, he was brought a wheelchair and cushoin and mattress. He refused all products. He states that he has an appropriate wheelchair and cushion even though in the past he has said that the only wheelchair that he has is the transfer chair that he has for appointments that is borrowed from a friend. And he states that he has a king size bed and wwas told that he would get an air mattress that was queen size so he refused the twin mattress. [facility] and advised them to call. I also advised them that [facility] was going to send out a Child psychotherapistsocial worker who would assist them and [facility] in figuring out what patient needs and assist in getting that for  patient. Electronic Signature(s) Signed: 06/24/2014 12:35:51 PM By: James Sitesorthy, Joanna Entered By: James Sitesorthy, Joanna on 06/24/2014 12:35:51 James [name] ([id number]) -------------------------------------------------------------------------------- Multi Wound Chart Details Patient Name: James [name] Date of Service: 06/24/2014 11:30 AM Medical Record Number: [id number]  Patient Account Number: [id number] Date of Birth/Sex: January 13, 1934 (81 y.o. Male) Treating RN: James [name] Primary Care Physician: James [name] Other Clinician: Referring Physician: Maudie [name] Treating Physician/Extender: James [name] in Treatment: 2 Vital Signs Height(in): 75 Pulse(bpm): 72 Weight(lbs): 230 Blood Pressure 122/100 (mmHg): Body Mass Index(BMI): 29 Temperature(F): 97.3 Respiratory Rate 20 (breaths/min): Photos: [1:No Photos] [N/A:N/A] Wound Location: [1:Left Gluteus] [N/A:N/A] Wounding Event: [1:Pressure Injury] [N/A:N/A] Primary Etiology: [1:Pressure Ulcer] [N/A:N/A] Comorbid History: [1:Asthma, Chronic Obstructive Pulmonary Disease (COPD), Arrhythmia, Congestive Heart Failure, Coronary Artery Disease, Type II Diabetes] [N/A:N/A] Date Acquired: [1:05/11/2014] [N/A:N/A] Weeks of Treatment: [1:2] [N/A:N/A] Wound Status: [1:Open] [N/A:N/A] Measurements L x W x D 3.5x1.2x0.1 [N/A:N/A] (cm) Area (cm) : [1:3.299] [N/A:N/A] Volume (cm) : [1:0.33] [N/A:N/A] % Reduction in Area: [1:96.10%] [N/A:N/A] % Reduction in Volume: 96.10% [N/A:N/A] Classification: [1:Category/Stage III] [N/A:N/A] Wound Margin: [1:Indistinct, nonvisible] [N/A:N/A] Granulation Amount: [1:Small (1-33%)] [N/A:N/A] Necrotic Amount: [1:None Present (0%)] [N/A:N/A] Exposed Structures: [1:Fascia: No Fat: No Tendon: No Muscle: No Joint: No Bone: No] [N/A:N/A] Limited to Skin Breakdown Epithelialization: Small (1-33%) N/A N/A Periwound Skin Texture: Edema: No N/A N/A Excoriation: No Induration: No Callus:  No Crepitus: No Fluctuance: No Friable: No Rash: No Scarring: No Periwound Skin Moist: Yes N/A N/A Moisture: Maceration: No Dry/Scaly: No Periwound Skin Color: Atrophie Blanche: No N/A N/A Cyanosis: No Ecchymosis: No Erythema: No Hemosiderin Staining: No Mottled: No Pallor: No Rubor: No Tenderness on No N/A N/A Palpation: Wound Preparation: Ulcer Cleansing: N/A N/A Rinsed/Irrigated with Saline Topical Anesthetic Applied: None Treatment Notes Electronic Signature(s) Signed: 06/24/2014 4:34:18 PM By: James [name] Entered By: James [name] on 06/24/2014 12:02:27 James [name] ([id number]) -------------------------------------------------------------------------------- Multi-Disciplinary Care Plan Details Patient Name: James [name] Date of Service: 06/24/2014 11:30 AM Medical Record Number: [id number] Patient Account Number: [id number] Date of Birth/Sex: 02/24/1933 (81 y.o. Male) Treating RN: James [name] Primary Care Physician: James [name] Other Clinician: Referring Physician: Maudie [name] Treating Physician/Extender: James [name] in Treatment: 2 Active Inactive Abuse / Safety / Falls / Self Care Management Nursing Diagnoses: Potential for falls Goals: Patient will remain injury free Date Initiated: 06/10/2014 Goal Status: Active Interventions: Assess fall risk on admission and as needed Notes: Nutrition Nursing Diagnoses: Potential for alteratiion in Nutrition/Potential for imbalanced nutrition Goals: Patient/caregiver agrees to and verbalizes understanding of need to use nutritional supplements and/or vitamins as prescribed Date Initiated: 06/10/2014 Goal Status: Active Interventions: Assess patient nutrition upon admission and as needed per policy Notes: Orientation to the Wound Care Program Nursing Diagnoses: Knowledge deficit related to the wound healing center program Goals: Patient/caregiver will verbalize understanding of the  Wound Healing Center Program James [name], James [name] ([id number]) Date Initiated: 06/10/2014 Goal Status: Active Interventions: Provide education on orientation to the wound center Notes: Pressure Nursing Diagnoses: Potential for impaired tissue integrity related to pressure, friction, moisture, and shear Goals: Patient will remain free of pressure ulcers Date Initiated: 06/10/2014 Goal Status: Active Interventions: Assess potential for pressure ulcer upon admission and as needed Notes: Wound/Skin Impairment Nursing Diagnoses: Knowledge deficit related to ulceration/compromised skin integrity Goals: Ulcer/skin breakdown will have a volume reduction of 30% by week 4 Date Initiated: 06/10/2014 Goal Status: Active Interventions: Assess ulceration(s) every visit Notes: Electronic Signature(s) Signed: 06/24/2014 4:34:18 PM By: James [name] Entered By: James [name] on 06/24/2014 12:02:17 James [name] ([id number]) -------------------------------------------------------------------------------- Pain Assessment Details Patient Name: James [name] Date of Service: 06/24/2014 11:30 AM Medical Record Number: [id number] Patient Account Number: [id number] Date of Birth/Sex: Mar 20, 1933 (81 y.o. Male) Treating RN: James [name] Primary Care Physician: James [name] Other  Clinician: Referring Physician: Maudie [name] Treating Physician/Extender: James [name] in Treatment: 2 Active Problems Location of Pain Severity and Description of Pain Patient Has Paino No Site Locations Pain Management and Medication Current Pain Management: Electronic Signature(s) Signed: 06/24/2014 4:39:41 PM By: James Gurney, RN, [name], James [name] Entered By: James Gurney, RN, [name], James on 06/24/2014 11:44:54 James [name] ([id number]) -------------------------------------------------------------------------------- Patient/Caregiver Education Details Patient Name: James [name] Date of Service: 06/24/2014 11:30  AM Medical Record Number: [id number] Patient Account Number: [id number] Date of Birth/Gender: [birth year] (81 y.o. Male) Treating RN: James [name] Primary Care Physician: James [name] Other Clinician: Referring Physician: Maudie [name] Treating Physician/Extender: James [name] in Treatment: 2 Education Assessment Education Provided To: Patient and Caregiver Education Topics Provided Pressure: Pressure Ulcers: Care and Offloading, Other: need for compliance with air mattress and other Handouts: surfaces Methods: Explain/Verbal Responses: State content correctly Electronic Signature(s) Signed: 06/24/2014 4:39:41 PM By: James Gurney, RN, [name], James [name] Entered By: James Gurney, RN, [name], James on 06/24/2014 12:13:35 James [name] ([id number]) -------------------------------------------------------------------------------- Wound Assessment Details Patient Name: James [name] Date of Service: 06/24/2014 11:30 AM Medical Record Number: [id number] Patient Account Number: [id number] Date of Birth/Sex: 07-24-1933 (80 y.o. Male) Treating RN: James [name] Primary Care Physician: James [name] Other Clinician: Referring Physician: Maudie [name] Treating Physician/Extender: James [name] in Treatment: 2 Wound Status Wound Number: 1 Primary Pressure Ulcer Etiology: Wound Location: Left Gluteus Wound Open Wounding Event: Pressure Injury Status: Date Acquired: 05/11/2014 Comorbid Asthma, Chronic Obstructive Pulmonary Weeks Of Treatment: 2 History: Disease (COPD), Arrhythmia, Clustered Wound: No Congestive Heart Failure, Coronary Artery Disease, Type II Diabetes Photos Photo Uploaded By: James Gurney, RN, [name], James on 06/24/2014 12:27:46 Wound Measurements Length: (cm) 3.5 Width: (cm) 1.2 Depth: (cm) 0.1 Area: (cm) 3.299 Volume: (cm) 0.33 % Reduction in Area: 96.1% % Reduction in Volume: 96.1% Epithelialization: Small (1-33%) Tunneling: No Undermining: No Wound  Description Classification: Category/Stage III Wound Margin: Indistinct, nonvisible Foul Odor After Cleansing: No Wound Bed Granulation Amount: Small (1-33%) Exposed Structure Necrotic Amount: None Present (0%) Fascia Exposed: No Fat Layer Exposed: No Tendon Exposed: No Muscle Exposed: No Joint Exposed: No James [name], James [name] ([id number]) Bone Exposed: No Limited to Skin Breakdown Periwound Skin Texture Texture Color No Abnormalities Noted: No No Abnormalities Noted: No Callus: No Atrophie Blanche: No Crepitus: No Cyanosis: No Excoriation: No Ecchymosis: No Fluctuance: No Erythema: No Friable: No Hemosiderin Staining: No Induration: No Mottled: No Localized Edema: No Pallor: No Rash: No Rubor: No Scarring: No Moisture No Abnormalities Noted: No Dry / Scaly: No Maceration: No Moist: Yes Wound Preparation Ulcer Cleansing: Rinsed/Irrigated with Saline Topical Anesthetic Applied: None Treatment Notes Wound #1 (Left Gluteus) 1. Cleansed with: Clean wound with Normal Saline 3. Peri-wound Care: Barrier cream 4. Dressing Applied: Aquacel 5. Secondary Dressing Applied Bordered Foam Dressing Electronic Signature(s) Signed: 06/24/2014 4:34:18 PM By: James [name] Signed: 06/24/2014 4:39:41 PM By: James Gurney RN, [name], James [name] Entered By: James [name] on 06/24/2014 12:04:57 James [name] ([id number]) -------------------------------------------------------------------------------- Wound Assessment Details Patient Name: James [name] Date of Service: 06/24/2014 11:30 AM Medical Record Number: [id number] Patient Account Number: [id number] Date of Birth/Sex: Mar 19, 1933 (81 y.o. Male) Treating RN: James [name] Primary Care Physician: James [name] Other Clinician: Referring Physician: Maudie [name] Treating Physician/Extender: James [name] in Treatment: 2 Wound Status Wound Number: 2 Primary Pressure Ulcer Etiology: Wound Location: Right  Gluteus Wound Open Wounding Event: Pressure Injury Status: Date Acquired: 06/24/2014 Comorbid Asthma, Chronic Obstructive Pulmonary Weeks Of Treatment: 0 History: Disease (COPD), Arrhythmia, Clustered Wound: No Congestive Heart  Failure, Coronary Artery Disease, Type II Diabetes Photos Photo Uploaded By: James GurneyWoody, RN, [name], James on 06/24/2014 12:28:26 Wound Measurements Length: (cm) 3.6 Width: (cm) 2 Depth: (cm) 0.1 Area: (cm) 5.655 Volume: (cm) 0.565 % Reduction in Area: 0% % Reduction in Volume: 0% Epithelialization: Small (1-33%) Tunneling: No Undermining: No Wound Description Classification: Category/Stage III Wound Margin: Indistinct, nonvisible Exudate Amount: Medium Exudate Type: Serous Exudate Color: amber Foul Odor After Cleansing: No Wound Bed Granulation Amount: Small (1-33%) Exposed Structure Necrotic Amount: None Present (0%) Fascia Exposed: No Fat Layer Exposed: No James [name], Will ([id number]) Tendon Exposed: No Muscle Exposed: No Joint Exposed: No Bone Exposed: No Limited to Skin Breakdown Periwound Skin Texture Texture Color No Abnormalities Noted: No No Abnormalities Noted: No Callus: No Atrophie Blanche: No Crepitus: No Cyanosis: No Excoriation: No Ecchymosis: No Fluctuance: No Erythema: No Friable: No Hemosiderin Staining: No Induration: No Mottled: No Localized Edema: No Pallor: No Rash: No Rubor: No Scarring: No Temperature / Pain Moisture Temperature: No Abnormality No Abnormalities Noted: No Dry / Scaly: No Maceration: No Moist: No Wound Preparation Ulcer Cleansing: Rinsed/Irrigated with Saline Topical Anesthetic Applied: Other: lidocaine 4%, Treatment Notes Wound #2 (Right Gluteus) 1. Cleansed with: Clean wound with Normal Saline 3. Peri-wound Care: Barrier cream 4. Dressing Applied: Aquacel 5. Secondary Dressing Applied Bordered Foam Dressing Electronic Signature(s) Signed: 06/24/2014 4:34:18 PM By: James Sitesorthy,  Joanna Entered By: James Sitesorthy, Joanna on 06/24/2014 12:05:53 James ErbENTON, Aj ([id number]) -------------------------------------------------------------------------------- Vitals Details Patient Name: James ErbENTON, Cormick Date of Service: 06/24/2014 11:30 AM Medical Record Number: [id number] Patient Account Number: [id number] Date of Birth/Sex: 06/26/1933 [age] y.o. Male) Treating RN: James CoventryWoody, James Primary Care Physician: James FlakesFeldpausch, Dale Other Clinician: Referring Physician: Maudie FlakesFeldpausch, Dale Treating Physician/Extender: James ReBritto, Errol Weeks in Treatment: 2 Vital Signs Time Taken: 11:45 Temperature (F): 97.3 Height (in): 75 Pulse (bpm): 72 Weight (lbs): 230 Respiratory Rate (breaths/min): 20 Body Mass Index (BMI): 28.7 Blood Pressure (mmHg): 122/100 Reference Range: 80 - 120 mg / dl Electronic Signature(s) Signed: 06/24/2014 4:39:41 PM By: James GurneyWoody, RN, [name], James [name] Entered By: James GurneyWoody, RN, [name], James on 06/24/2014 11:45:44

## 2014-07-02 ENCOUNTER — Encounter: Payer: Medicare Other | Admitting: Surgery

## 2014-07-02 DIAGNOSIS — L89313 Pressure ulcer of right buttock, stage 3: Secondary | ICD-10-CM | POA: Diagnosis not present

## 2014-07-02 NOTE — Progress Notes (Addendum)
SANTONIO, SPEAKMAN ([id number]) Visit Report for 07/02/2014 Chief Complaint Document Details Patient Name: James [name], James [name] Date of Service: 07/02/2014 11:30 AM Medical Record Number: [id number] Patient Account Number: [id number] Date of Birth/Sex: 31-Oct-1933 (81 y.o. Male) Treating RN: Primary Care Physician: Maudie Flakes Other Clinician: Referring Physician: Maudie Flakes Treating Physician/Extender: BURNS, Regis Bill in Treatment: 3 Information Obtained from: Patient Chief Complaint Patient presents to the wound care center for a consult due non healing wound. this 81 year old gentleman comes with bilateral gluteal ulcerations which he's had for about a month. Electronic Signature(s) Signed: 07/02/2014 2:14:37 PM By: Madelaine Bhat MD Entered By: Madelaine Bhat on 07/02/2014 12:16:23 James [name] ([id number]) -------------------------------------------------------------------------------- HPI Details Patient Name: James [name] Date of Service: 07/02/2014 11:30 AM Medical Record Number: [id number] Patient Account Number: [id number] Date of Birth/Sex: [birth year] (81 y.o. Male) Treating RN: Primary Care Physician: Maudie Flakes Other Clinician: Referring Physician: Maudie Flakes Treating Physician/Extender: BURNS, Regis Bill in Treatment: 3 History of Present Illness Location: Patient presents with an ulcer on the right and left buttock Quality: Patient reports experiencing a dull pain to affected area(s). Severity: Patient states wound are getting worse. Duration: Patient has had the wound for > 3 months prior to seeking treatment at the wound center Timing: Pain in wound is Intermittent (comes and goes Context: The wound occurred when the patient had a fall and injured himself on his side but he was on his bottom and dragged himself for a while until he could get to a phone. Modifying Factors: he sits in a chair for about 18 hours a day and sleeps about 6  hours. Associated Signs and Symptoms: Patient reports having difficulty standing for long periods. HPI Description: very pleasant 81 year old gentleman whose had chronic morbidities including asthma, diabetes mellitus type 2, chronic disease, COPD, hypertension, atrial fibrillation, SVT, CAD, cardiomyopathy, congestive heart failure. About a month ago he had a fall and during that time he had to drag himself on his bottom for a bit until he could get to a phone. He thinks some of this problem started at that time. His diabetes is diet controlled and he does not have to take any medications for a long while. His last hemoglobin A1c was 5.8. Was a former smoker and has not smoked since 1985. He is not taken any specific treatment for his present problems. 06/17/2014 -- he has been very diligent in offloading as much as possible through the entire week and this shows in the excellent healing he has achieved. 06/24/2014 -- he is working with the windows to get his air mattress and his questions for the wheelchair. We have discussed the benefits and alternatives of this method of offloading in great detail. 07/01/2014 - has received air mattress and wheelchair cushion. no complaints today. No significant pain. No drainage. No fever or chills. Electronic Signature(s) Signed: 07/02/2014 2:14:37 PM By: Madelaine Bhat MD Entered By: Madelaine Bhat on 07/02/2014 12:17:11 James [name] ([id number]) -------------------------------------------------------------------------------- Physical Exam Details Patient Name: James [name] Date of Service: 07/02/2014 11:30 AM Medical Record Number: [id number] Patient Account Number: [id number] Date of Birth/Sex: 11/05/1933 (81 y.o. Male) Treating RN: Primary Care Physician: Maudie Flakes Other Clinician: Referring Physician: Maudie Flakes Treating Physician/Extender: BURNS, Regis Bill in Treatment: 3 Constitutional . Pulse regular.  Respirations normal and unlabored. Afebrile. Marland Kitchen Respiratory WNL. No retractions.. Cardiovascular Femoral Pulses WNL, no bruits. Integumentary (Hair, Skin) .Marland Kitchen Neurological Sensation normal to touch, pin,and vibration. Psychiatric Judgement and insight Intact.. Oriented times 3.. No evidence of depression,  anxiety, or agitation.. Notes Bilateral buttocks ulcerations are completely re-epithelialized. No drainage. Minimally tender. No evidence for infection. Electronic Signature(s) Signed: 07/02/2014 2:14:37 PM By: Madelaine Bhat MD Entered By: Madelaine Bhat on 07/02/2014 12:17:45 James [name] ([id number]) -------------------------------------------------------------------------------- Physician Orders Details Patient Name: James [name] Date of Service: 07/02/2014 11:30 AM Medical Record Number: [id number] Patient Account Number: [id number] Date of Birth/Sex: [birth year] (81 y.o. Male) Treating RN: Huel Coventry Primary Care Physician: Maudie Flakes Other Clinician: Referring Physician: Maudie Flakes Treating Physician/Extender: BURNS, Regis Bill in Treatment: 3 Verbal / Phone Orders: Yes Clinician: Huel Coventry Read Back and Verified: Yes Diagnosis Coding Home Health o D/C Home Health Services Discharge From [facility] o Discharge from Wound Care Center - Patient healed. Barrier cream applied to buttock daily. Electronic Signature(s) Signed: 07/02/2014 2:10:21 PM By: Elliot Gurney RN, BSN, Kim RN, BSN Signed: 07/02/2014 2:14:37 PM By: Madelaine Bhat MD Entered By: Elliot Gurney RN, BSN, Kim on 07/02/2014 12:12:56 James [name] ([id number]) -------------------------------------------------------------------------------- Problem List Details Patient Name: James [name] Date of Service: 07/02/2014 11:30 AM Medical Record Number: [id number] Patient Account Number: [id number] Date of Birth/Sex: 02-05-1934 (81 y.o. Male) Treating RN: Primary Care Physician: Maudie Flakes Other Clinician: Referring Physician: Maudie Flakes Treating Physician/Extender: BURNS, Regis Bill in Treatment: 3 Active Problems ICD-10 Encounter Code Description Active Date Diagnosis E11.622 Type 2 diabetes mellitus with other skin ulcer 06/10/2014 Yes L89.323 Pressure ulcer of left buttock, stage 3 06/10/2014 Yes L89.313 Pressure ulcer of right buttock, stage 3 06/10/2014 Yes Inactive Problems Resolved Problems Electronic Signature(s) Signed: 07/02/2014 2:14:37 PM By: Madelaine Bhat MD Entered By: Madelaine Bhat on 07/02/2014 12:16:11 James [name] ([id number]) -------------------------------------------------------------------------------- Progress Note Details Patient Name: James [name] Date of Service: 07/02/2014 11:30 AM Medical Record Number: [id number] Patient Account Number: [id number] Date of Birth/Sex: 1933/07/27 (81 y.o. Male) Treating RN: Primary Care Physician: Maudie Flakes Other Clinician: Referring Physician: Maudie Flakes Treating Physician/Extender: BURNS III, Regis Bill in Treatment: 3 Subjective Chief Complaint Information obtained from Patient Patient presents to the wound care center for a consult due non healing wound. this 81 year old gentleman comes with bilateral gluteal ulcerations which he's had for about a month. History of Present Illness (HPI) The following HPI elements were documented for the patient's wound: Location: Patient presents with an ulcer on the right and left buttock Quality: Patient reports experiencing a dull pain to affected area(s). Severity: Patient states wound are getting worse. Duration: Patient has had the wound for > 3 months prior to seeking treatment at the wound center Timing: Pain in wound is Intermittent (comes and goes Context: The wound occurred when the patient had a fall and injured himself on his side but he was on his bottom and dragged himself for a while until he could get to a  phone. Modifying Factors: he sits in a chair for about 18 hours a day and sleeps about 6 hours. Associated Signs and Symptoms: Patient reports having difficulty standing for long periods. very pleasant 81 year old gentleman whose had chronic morbidities including asthma, diabetes mellitus type 2, chronic disease, COPD, hypertension, atrial fibrillation, SVT, CAD, cardiomyopathy, congestive heart failure. About a month ago he had a fall and during that time he had to drag himself on his bottom for a bit until he could get to a phone. He thinks some of this problem started at that time. His diabetes is diet controlled and he does not have to take any medications for a long while. His last hemoglobin A1c was 5.8. Was a  former smoker and has not smoked since 1985. He is not taken any specific treatment for his present problems. 06/17/2014 -- he has been very diligent in offloading as much as possible through the entire week and this shows in the excellent healing he has achieved. 06/24/2014 -- he is working with the windows to get his air mattress and his questions for the wheelchair. We have discussed the benefits and alternatives of this method of offloading in great detail. 07/01/2014 - has received air mattress and wheelchair cushion. no complaints today. No significant pain. No drainage. No fever or chills. James ErbDENTON, Johne ([id number]) Objective Constitutional Pulse regular. Respirations normal and unlabored. Afebrile. Vitals Time Taken: 11:50 AM, Height: 75 in, Weight: 230 lbs, BMI: 28.7, Temperature: 97.5 F, Pulse: 76 bpm, Respiratory Rate: 18 breaths/min, Blood Pressure: 126/68 mmHg. General Notes: Recheck: Patient instructed to follow-up with PCP regarding low BP. Patient stated he was tired, due to not sleeping well on his new mattress. Respiratory WNL. No retractions.. Cardiovascular Femoral Pulses WNL, no bruits. Neurological Sensation normal to touch, pin,and  vibration. Psychiatric Judgement and insight Intact.. Oriented times 3.. No evidence of depression, anxiety, or agitation.. General Notes: Bilateral buttocks ulcerations are completely re-epithelialized. No drainage. Minimally tender. No evidence for infection. Integumentary (Hair, Skin) Wound #1 status is Healed - Epithelialized. Original cause of wound was Pressure Injury. The wound is located on the Left Gluteus. The wound measures 0cm length x 0cm width x 0cm depth; 0cm^2 area and 0cm^3 volume. Wound #2 status is Healed - Epithelialized. Original cause of wound was Pressure Injury. The wound is located on the Right Gluteus. The wound measures 0cm length x 0cm width x 0cm depth; 0cm^2 area and 0cm^3 volume. Assessment Active Problems ICD-10 E11.622 - Type 2 diabetes mellitus with other skin ulcer [place], Harvest ([id number]) N82.956L89.323 - Pressure ulcer of left buttock, stage 3 L89.313 - Pressure ulcer of right buttock, stage 3 Diagnoses ICD-10 E11.622: Type 2 diabetes mellitus with other skin ulcer L89.323: Pressure ulcer of left buttock, stage 3 L89.313: Pressure ulcer of right buttock, stage 3 healed bilateral buttocks pressure ulcerations. Plan Home Health: D[facility]C [facility] - Patient healed. Barrier cream applied to buttock daily. I reassured him that everything looks good. Recommended zinc oxide ointment for another week or two. continue offloading with air mattress and wheelchair conditions. Patient will contact his PCP regarding physical therapy. I encouraged him to call at anytime with any questions or concerns. Otherwise, we will plan on seeing him back in the wound clinic on a when necessary basis. Electronic Signature(s) Signed: 09/15/2014 10:09:15 AM By: Sallee ProvencalWallace, RCP,RRT,CHT, Sallie RCP, RRT, CHT Signed: 09/15/2014 11:01:40 AM By: Madelaine BhatBurns, III, Malique Driskill MD Previous Signature: 07/02/2014 2:14:37 PM Version  By: Madelaine BhatBurns, III, Rue Tinnel MD Entered By: Dayton MartesWallace, RCP,RRT,CHT, Sallie on 09/15/2014 10:09:15 James ErbENTON, Cyruss ([id number]) -------------------------------------------------------------------------------- SuperBill Details Patient Name: James ErbENTON, Fremon Date of Service: 07/02/2014 Medical Record Number: [id number] Patient Account Number: [id number] Date of Birth/Sex: 05/21/1933 [age] y.o. Male) Treating RN: Primary Care Physician: Maudie FlakesFeldpausch, Dale Other Clinician: Referring Physician: Maudie FlakesFeldpausch, Dale Treating Physician/Extender: BURNS, Regis BillWALTER Weeks in Treatment: 3 Diagnosis Coding ICD-10 Codes Code Description [id number] Type 2 diabetes mellitus with other skin ulcer L89.323 Pressure ulcer of left buttock, stage 3 L89.313 Pressure ulcer of right buttock, stage 3 Facility Procedures CPT4 Code: [id number] Description: [id number] - WOUND CARE VISIT-LEV 2 EST PT Modifier: Quantity: 1 Physician Procedures CPT4 Code: [id number] Description: 99202 - WC PHYS LEVEL 2 -  NEW PT ICD-10 Description Diagnosis L89.323 Pressure ulcer of left buttock, stage 3 L89.313 Pressure ulcer of right buttock, stage 3 Modifier: Quantity: 1 Electronic Signature(s) Signed: 07/02/2014 2:14:37 PM By: Madelaine Bhat MD Entered By: Madelaine Bhat on 07/02/2014 12:19:59

## 2014-07-02 NOTE — Progress Notes (Signed)
KACEN, MELLINGER ([id number]) Visit Report for 07/02/2014 Arrival Information Details Patient Name: James [name], James [name] Date of Service: 07/02/2014 11:30 AM Medical Record Number: [id number] Patient Account Number: [id number] Date of Birth/Sex: 1933-12-16 (81 y.o. Male) Treating RN: Huel Coventry Primary Care Physician: Maudie Flakes Other Clinician: Referring Physician: Maudie Flakes Treating Physician/Extender: BURNS, Regis Bill in Treatment: 3 Visit Information History Since Last Visit Added or deleted any medications: No Patient Arrived: Wheel Chair Any new allergies or adverse reactions: No Arrival Time: 11:48 Had a fall or experienced change in No activities of daily living that may affect Accompanied By: wife risk of falls: Transfer Assistance: Manual Signs or symptoms of abuse/neglect since last No Patient Identification Verified: Yes visito Secondary Verification Process Yes Hospitalized since last visit: No Completed: Has Dressing in Place as Prescribed: No Patient Requires Transmission-Based No Pain Present Now: No Precautions: Patient Has Alerts: No Electronic Signature(s) Signed: 07/02/2014 2:10:21 PM By: Elliot Gurney, RN, BSN, Kim RN, BSN Entered By: Elliot Gurney, RN, BSN, Kim on 07/02/2014 11:49:01 James [name] ([id number]) -------------------------------------------------------------------------------- Clinic Level of Care Assessment Details Patient Name: James [name] Date of Service: 07/02/2014 11:30 AM Medical Record Number: [id number] Patient Account Number: [id number] Date of Birth/Sex: 1933-11-01 (80 y.o. Male) Treating RN: Huel Coventry Primary Care Physician: Maudie Flakes Other Clinician: Referring Physician: Maudie Flakes Treating Physician/Extender: BURNS, Regis Bill in Treatment: 3 Clinic Level of Care Assessment Items TOOL 4 Quantity Score  - Use when only an EandM is performed on FOLLOW-UP visit 0 ASSESSMENTS - Nursing Assessment /  Reassessment  - Reassessment of Co-morbidities (includes updates in patient status) 0 X - Reassessment of Adherence to Treatment Plan 1 5 ASSESSMENTS - Wound and Skin Assessment / Reassessment X - Simple Wound Assessment / Reassessment - one wound 1 5  - Complex Wound Assessment / Reassessment - multiple wounds 0  - Dermatologic / Skin Assessment (not related to wound area) 0 ASSESSMENTS - Focused Assessment  - Circumferential Edema Measurements - multi extremities 0  - Nutritional Assessment / Counseling / Intervention 0  - Lower Extremity Assessment (monofilament, tuning fork, pulses) 0  - Peripheral Arterial Disease Assessment (using hand held doppler) 0 ASSESSMENTS - Ostomy and/or Continence Assessment and Care  - Incontinence Assessment and Management 0  - Ostomy Care Assessment and Management (repouching, etc.) 0 PROCESS - Coordination of Care X - Simple Patient / Family Education for ongoing care 1 15  - Complex (extensive) Patient / Family Education for ongoing care 0 X - Staff obtains Chiropractor, Records, Test Results / Process Orders 1 10  - Staff telephones HHA, Nursing Homes / Clarify orders / etc 0  - Routine Transfer to another Facility (non-emergent condition) 0 James [name], James [name] ([id number])  - Routine Hospital Admission (non-emergent condition) 0  - New Admissions / Manufacturing engineer / Ordering NPWT, Apligraf, etc. 0  - Emergency Hospital Admission (emergent condition) 0 X - Simple Discharge Coordination 1 10  - Complex (extensive) Discharge Coordination 0 PROCESS - Special Needs  - Pediatric / Minor Patient Management 0  - Isolation Patient Management 0  - Hearing / Language / Visual special needs 0  - Assessment of Community assistance (transportation, D/C planning, etc.) 0  - Additional assistance / Altered mentation 0  - Support Surface(s) Assessment (bed, cushion, seat, etc.) 0 INTERVENTIONS - Wound Cleansing /  Measurement X - Simple Wound Cleansing - one wound 1 5  - Complex Wound Cleansing - multiple wounds 0 X - Wound Imaging (photographs - any number of wounds) 1 5  -  Wound Tracing (instead of photographs) 0 X - Simple Wound Measurement - one wound 1 5 []  - Complex Wound Measurement - multiple wounds 0 INTERVENTIONS - Wound Dressings X - Small Wound Dressing one or multiple wounds 1 10 []  - Medium Wound Dressing one or multiple wounds 0 []  - Large Wound Dressing one or multiple wounds 0 []  - Application of Medications - topical 0 []  - Application of Medications - injection 0 INTERVENTIONS - Miscellaneous []  - External ear exam 0 James [name], James [name] ([id number]) []  - Specimen Collection (cultures, biopsies, blood, body fluids, etc.) 0 []  - Specimen(s) / Culture(s) sent or taken to Lab for analysis 0 []  - Patient Transfer (multiple staff / Michiel SitesHoyer Lift / Similar devices) 0 []  - Simple Staple / Suture removal (25 or less) 0 []  - Complex Staple / Suture removal (26 or more) 0 []  - Hypo / Hyperglycemic Management (close monitor of Blood Glucose) 0 []  - Ankle / Brachial Index (ABI) - do not check if billed separately 0 X - Vital Signs 1 5 Has the patient been seen at the hospital within the last three years: Yes Total Score: 75 Level Of Care: New/Established - Level 2 Electronic Signature(s) Signed: 07/02/2014 2:10:21 PM By: Elliot GurneyWoody, RN, BSN, Kim RN, BSN Entered By: Elliot GurneyWoody, RN, BSN, Kim on 07/02/2014 12:13:37 James ErbENTON, James [name] ([id number]) -------------------------------------------------------------------------------- Encounter Discharge Information Details Patient Name: James ErbENTON, James [name] Date of Service: 07/02/2014 11:30 AM Medical Record Number: [id number] Patient Account Number: [id number] Date of Birth/Sex: 03/27/1933 [age] y.o. Male) Treating RN: Primary Care Physician: Maudie FlakesFeldpausch, Dale Other Clinician: Referring Physician: Maudie FlakesFeldpausch, Dale Treating Physician/Extender: BURNS, Regis BillWALTER Weeks in  Treatment: 3 Encounter Discharge Information Items Discharge Pain Level: 0 Discharge Condition: Stable Ambulatory Status: Wheelchair Discharge Destination: Home Transportation: Private Auto Accompanied By: wife Schedule Follow-up Appointment: Yes Medication Reconciliation completed and provided to Patient/Care Yes Zareena Willis: Provided on Clinical Summary of Care: 07/02/2014 Form Type Recipient Paper Patient MD Electronic Signature(s) Signed: 07/02/2014 2:10:21 PM By: Elliot GurneyWoody, RN, BSN, Kim RN, BSN Previous Signature: 07/02/2014 12:16:35 PM Version By: Gwenlyn PerkingMoore, Shelia Entered By: Elliot GurneyWoody, RN, BSN, Kim on 07/02/2014 12:17:37 James ErbENTON, James [name] ([id number]) -------------------------------------------------------------------------------- Multi Wound Chart Details Patient Name: James ErbENTON, James [name] Date of Service: 07/02/2014 11:30 AM Medical Record Number: [id number] Patient Account Number: [id number] Date of Birth/Sex: 07/06/1933 [age] y.o. Male) Treating RN: Huel CoventryWoody, Kim Primary Care Physician: Maudie FlakesFeldpausch, Dale Other Clinician: Referring Physician: Maudie FlakesFeldpausch, Dale Treating Physician/Extender: BURNS, Regis BillWALTER Weeks in Treatment: 3 Vital Signs Height(in): 75 Pulse(bpm): 76 Weight(lbs): 230 Blood Pressure 126/42 (mmHg): Body Mass Index(BMI): 29 Temperature(F): 97.5 Respiratory Rate 18 (breaths/min): Photos: [1:No Photos] [2:No Photos] [N/A:N/A] Wound Location: [1:Left Gluteus] [2:Right Gluteus] [N/A:N/A] Wounding Event: [1:Pressure Injury] [2:Pressure Injury] [N/A:N/A] Primary Etiology: [1:Pressure Ulcer] [2:Pressure Ulcer] [N/A:N/A] Date Acquired: [1:05/11/2014] [2:06/24/2014] [N/A:N/A] Weeks of Treatment: [1:3] [2:1] [N/A:N/A] Wound Status: [1:Healed - Epithelialized] [2:Healed - Epithelialized] [N/A:N/A] Measurements L x W x D 0x0x0 [2:0x0x0] [N/A:N/A] (cm) Area (cm) : [1:0] [2:0] [N/A:N/A] Volume (cm) : [1:0] [2:0] [N/A:N/A] % Reduction in Area: [1:100.00%] [2:100.00%] [N/A:N/A] %  Reduction in Volume: 100.00% [2:100.00%] [N/A:N/A] Classification: [1:Category/Stage III] [2:Category/Stage III] [N/A:N/A] Periwound Skin Texture: No Abnormalities Noted [2:No Abnormalities Noted] [N/A:N/A] Periwound Skin [1:No Abnormalities Noted] [2:No Abnormalities Noted] [N/A:N/A] Moisture: Periwound Skin Color: No Abnormalities Noted [2:No Abnormalities Noted] [N/A:N/A] Tenderness on [1:No] [2:No] [N/A:N/A] Treatment Notes Electronic Signature(s) Signed: 07/02/2014 2:10:21 PM By: Elliot GurneyWoody, RN, BSN, Kim RN, BSN Entered By: Elliot GurneyWoody, RN, BSN, Kim on 07/02/2014 12:10:52 James ErbENTON, James [name] ([id number]) -------------------------------------------------------------------------------- Multi-Disciplinary Care Plan Details Patient Name: James ErbENTON, James [name]  Date of Service: 07/02/2014 11:30 AM Medical Record Number: [id number] Patient Account Number: [id number] Date of Birth/Sex: 04-07-1933 (81 y.o. Male) Treating RN: Huel Coventry Primary Care Physician: Maudie Flakes Other Clinician: Referring Physician: Maudie Flakes Treating Physician/Extender: BURNS, Regis Bill in Treatment: 3 Active Inactive Electronic Signature(s) Signed: 07/02/2014 2:10:21 PM By: Elliot Gurney, RN, BSN, Kim RN, BSN Entered By: Elliot Gurney, RN, BSN, Kim on 07/02/2014 12:10:42 James [name] ([id number]) -------------------------------------------------------------------------------- Pain Assessment Details Patient Name: James [name] Date of Service: 07/02/2014 11:30 AM Medical Record Number: [id number] Patient Account Number: [id number] Date of Birth/Sex: 1933-10-20 (80 y.o. Male) Treating RN: Huel Coventry Primary Care Physician: Maudie Flakes Other Clinician: Referring Physician: Maudie Flakes Treating Physician/Extender: BURNS, Regis Bill in Treatment: 3 Active Problems Location of Pain Severity and Description of Pain Patient Has Paino No Site Locations Pain Management and Medication Current Pain Management: Electronic  Signature(s) Signed: 07/02/2014 2:10:21 PM By: Elliot Gurney, RN, BSN, Kim RN, BSN Entered By: Elliot Gurney, RN, BSN, Kim on 07/02/2014 11:49:08 James [name] ([id number]) -------------------------------------------------------------------------------- Patient/Caregiver Education Details Patient Name: James [name] Date of Service: 07/02/2014 11:30 AM Medical Record Number: [id number] Patient Account Number: [id number] Date of Birth/Gender: [birth year] (80 y.o. Male) Treating RN: Huel Coventry Primary Care Physician: Maudie Flakes Other Clinician: Referring Physician: Maudie Flakes Treating Physician/Extender: Frances Furbish in Treatment: 3 Education Assessment Education Provided To: Patient and Caregiver Education Topics Provided Pressure: Handouts: Other: Use Air mattress and chair cushion to prevent further breakdown Electronic Signature(s) Signed: 07/02/2014 2:10:21 PM By: Elliot Gurney, RN, BSN, Kim RN, BSN Entered By: Elliot Gurney, RN, BSN, Kim on 07/02/2014 12:18:42 James [name] ([id number]) -------------------------------------------------------------------------------- Wound Assessment Details Patient Name: James [name] Date of Service: 07/02/2014 11:30 AM Medical Record Number: [id number] Patient Account Number: [id number] Date of Birth/Sex: 1933/12/26 (80 y.o. Male) Treating RN: Huel Coventry Primary Care Physician: Maudie Flakes Other Clinician: Referring Physician: Maudie Flakes Treating Physician/Extender: BURNS, Regis Bill in Treatment: 3 Wound Status Wound Number: 1 Primary Etiology: Pressure Ulcer Wound Location: Left Gluteus Wound Status: Healed - Epithelialized Wounding Event: Pressure Injury Date Acquired: 05/11/2014 Weeks Of Treatment: 3 Clustered Wound: No Photos Photo Uploaded By: Curtis Sites on 07/02/2014 16:00:44 Wound Measurements Length: (cm) 0 % Reduction in Width: (cm) 0 % Reduction in Depth: (cm) 0 Area: (cm) 0 Volume: (cm) 0 Area:  100% Volume: 100% Wound Description Classification: Category/Stage III Periwound Skin Texture Texture Color No Abnormalities Noted: No No Abnormalities Noted: No Moisture No Abnormalities Noted: No Electronic Signature(s) Signed: 07/02/2014 2:10:21 PM By: Elliot Gurney, RN, BSN, Kim RN, BSN James [name], James [name] ([id number]) Entered By: Elliot Gurney, RN, BSN, Kim on 07/02/2014 12:10:19 James [name] ([id number]) -------------------------------------------------------------------------------- Wound Assessment Details Patient Name: James [name] Date of Service: 07/02/2014 11:30 AM Medical Record Number: [id number] Patient Account Number: [id number] Date of Birth/Sex: [birth year] (80 y.o. Male) Treating RN: Huel Coventry Primary Care Physician: Maudie Flakes Other Clinician: Referring Physician: Maudie Flakes Treating Physician/Extender: BURNS, Regis Bill in Treatment: 3 Wound Status Wound Number: 2 Primary Etiology: Pressure Ulcer Wound Location: Right Gluteus Wound Status: Healed - Epithelialized Wounding Event: Pressure Injury Date Acquired: 06/24/2014 Weeks Of Treatment: 1 Clustered Wound: No Photos Photo Uploaded By: Curtis Sites on 07/02/2014 16:00:44 Wound Measurements Length: (cm) 0 % Reduction in Width: (cm) 0 % Reduction in Depth: (cm) 0 Area: (cm) 0 Volume: (cm) 0 Area: 100% Volume: 100% Wound Description Classification: Category/Stage III Periwound Skin Texture Texture Color No Abnormalities Noted: No No Abnormalities Noted: No Moisture No Abnormalities Noted: No Electronic Signature(s) Signed: 07/02/2014 2:10:21 PM By: Elliot Gurney, RN, BSN, Kim RN,  BSN James [name], James [name] ([id number]) Entered By: Elliot Gurney RN, BSN, Kim on 07/02/2014 12:10:20 James [name] ([id number]) -------------------------------------------------------------------------------- Vitals Details Patient Name: James [name] Date of Service: 07/02/2014 11:30 AM Medical Record Number: [id number] Patient  Account Number: [id number] Date of Birth/Sex: Apr 22, 1933 (80 y.o. Male) Treating RN: Huel Coventry Primary Care Physician: Maudie Flakes Other Clinician: Referring Physician: Maudie Flakes Treating Physician/Extender: BURNS, Regis Bill in Treatment: 3 Vital Signs Time Taken: 11:50 Temperature (F): 97.5 Height (in): 75 Pulse (bpm): 76 Weight (lbs): 230 Respiratory Rate (breaths/min): 18 Body Mass Index (BMI): 28.7 Blood Pressure (mmHg): 126/68 Reference Range: 80 - 120 mg / dl Notes Recheck: Patient instructed to follow-up with PCP regarding low BP. Patient stated he was tired, due to not sleeping well on his new mattress. Electronic Signature(s) Signed: 07/02/2014 2:10:21 PM By: Elliot Gurney, RN, BSN, Kim RN, BSN Entered By: Elliot Gurney, RN, BSN, Kim on 07/02/2014 12:25:20

## 2014-09-13 ENCOUNTER — Encounter: Payer: Medicare Other | Attending: Surgery | Admitting: Surgery

## 2014-09-13 DIAGNOSIS — F419 Anxiety disorder, unspecified: Secondary | ICD-10-CM | POA: Insufficient documentation

## 2014-09-13 DIAGNOSIS — L89312 Pressure ulcer of right buttock, stage 2: Secondary | ICD-10-CM | POA: Diagnosis not present

## 2014-09-13 DIAGNOSIS — J449 Chronic obstructive pulmonary disease, unspecified: Secondary | ICD-10-CM | POA: Insufficient documentation

## 2014-09-13 DIAGNOSIS — L89322 Pressure ulcer of left buttock, stage 2: Secondary | ICD-10-CM | POA: Insufficient documentation

## 2014-09-13 DIAGNOSIS — J45909 Unspecified asthma, uncomplicated: Secondary | ICD-10-CM | POA: Diagnosis not present

## 2014-09-13 DIAGNOSIS — I509 Heart failure, unspecified: Secondary | ICD-10-CM | POA: Diagnosis not present

## 2014-09-13 DIAGNOSIS — E669 Obesity, unspecified: Secondary | ICD-10-CM | POA: Insufficient documentation

## 2014-09-13 DIAGNOSIS — I2510 Atherosclerotic heart disease of native coronary artery without angina pectoris: Secondary | ICD-10-CM | POA: Diagnosis not present

## 2014-09-13 DIAGNOSIS — E11622 Type 2 diabetes mellitus with other skin ulcer: Secondary | ICD-10-CM | POA: Diagnosis not present

## 2014-09-13 NOTE — Progress Notes (Addendum)
[name], [name] ([id number]) Visit Report for 09/13/2014 Chief Complaint Document Details Patient Name: James [name], James [name] Date of Service: 09/13/2014 2:30 PM Medical Record Number: [id number] Patient Account Number: [id number] Date of Birth/Sex: September 02, 1933 (81 y.o. Male) Treating RN: Primary Care Physician: Maudie Flakes Other Clinician: Referring Physician: Maudie Flakes Treating Physician/Extender: Rudene Re in Treatment: 0 Information Obtained from: Patient Chief Complaint Patient returns to the wound care center for reopened ulcer to: both gluteal areas. He was treated in May 2016 by me and did very well with conservator therapy. Electronic Signature(s) Signed: 09/13/2014 3:30:04 PM By: Evlyn Kanner MD, FACS Previous Signature: 09/13/2014 3:28:07 PM Version By: Evlyn Kanner MD, FACS Previous Signature: 09/13/2014 2:59:19 PM Version By: Evlyn Kanner MD, FACS Entered By: Evlyn Kanner on 09/13/2014 15:30:04 James [name] ([id number]) -------------------------------------------------------------------------------- HPI Details Patient Name: James [name] Date of Service: 09/13/2014 2:30 PM Medical Record Number: [id number] Patient Account Number: [id number] Date of Birth/Sex: September 28, 1933 (81 y.o. Male) Treating RN: Primary Care Physician: Maudie Flakes Other Clinician: Referring Physician: Maudie Flakes Treating Physician/Extender: Rudene Re in Treatment: 0 History of Present Illness Location: Patient presents with an ulcer on the right and left buttock Quality: Patient reports experiencing a dull pain to affected area(s). Severity: Patient states wound are getting worse. Duration: Patient has had the wound for > 3 months prior to seeking treatment at the wound center Timing: Pain in wound is Intermittent (comes and goes Context: The wound occurred when the patient had a fall and injured himself on his side but he was on his bottom and dragged himself for a  while until he could get to a phone. Modifying Factors: he sits in a chair for about 18 hours a day and sleeps about 6 hours. Associated Signs and Symptoms: Patient reports having difficulty standing for long periods. HPI Description: The patient was seen recently by Dr. Maryjane Hurter on 09/07/2014 with a return of the pressure ulcers which he had on the bilateral gluteal regions. His most recent hemoglobin A1c was 5.8 that day. He was known to our wound care center for a similar problem in May 2016 and was seen by me. 81 year old gentleman whose had chronic morbidities including asthma, diabetes mellitus type 2, chronic disease, COPD, hypertension, atrial fibrillation, SVT, CAD, cardiomyopathy, congestive heart failure. His diabetes is diet controlled and he does not have to take any medications for a long while. His last hemoglobin A1c was 5.8. Was a former smoker and has not smoked since 1985.He is not taken any specific treatment for his present problems. during his last visit here he has received air mattress and wheelchair cushion. However he sent it back for possible monetary reasons and has not been using those. He continues to offload as was discussed with him last time but I don't know how diligent he is being. He does walk around a little bit around the house. Electronic Signature(s) Signed: 09/13/2014 3:31:37 PM By: Evlyn Kanner MD, FACS Previous Signature: 09/13/2014 3:02:29 PM Version By: Evlyn Kanner MD, FACS Entered By: Evlyn Kanner on 09/13/2014 15:31:37 James [name] ([id number]) -------------------------------------------------------------------------------- Physical Exam Details Patient Name: James [name] Date of Service: 09/13/2014 2:30 PM Medical Record Number: [id number] Patient Account Number: [id number] Date of Birth/Sex: [birth year] (81 y.o. Male) Treating RN: Primary Care Physician: Maudie Flakes Other Clinician: Referring Physician: Maudie Flakes Treating  Physician/Extender: Rudene Re in Treatment: 0 Constitutional . Pulse regular. Respirations normal and unlabored. Afebrile. . Eyes Nonicteric. Reactive to light. Ears, Nose, Mouth, and Throat Lips, teeth, and gums WNL.Marland Kitchen  Moist mucosa without lesions . Neck supple and nontender. No palpable supraclavicular or cervical adenopathy. Normal sized without goiter. Respiratory WNL. No retractions.. Breath sounds WNL, No rubs, rales, rhonchi, or wheeze.. Cardiovascular Heart rhythm and rate regular, no murmur or gallop.. Pedal Pulses WNL. No clubbing, cyanosis or edema. Gastrointestinal (GI) Abdomen without masses or tenderness.. No liver or spleen enlargement or tenderness.. Musculoskeletal Adexa without tenderness or enlargement.. Digits and nails w/o clubbing, cyanosis, infection, petechiae, ischemia, or inflammatory conditions.. Integumentary (Hair, Skin) No suspicious lesions. No crepitus or fluctuance. No peri-wound warmth or erythema. No masses.Marland Kitchen Psychiatric Judgement and insight Intact.. No evidence of depression, anxiety, or agitation.. Notes He has stage II pressure ulcers both gluteal areas and they are not infected nor do they have surrounding cellulitis. Electronic Signature(s) Signed: 09/13/2014 3:32:09 PM By: Evlyn Kanner MD, FACS Entered By: Evlyn Kanner on 09/13/2014 15:32:09 James [name] ([id number]) -------------------------------------------------------------------------------- Physician Orders Details Patient Name: James [name] Date of Service: 09/13/2014 2:30 PM Medical Record Number: [id number] Patient Account Number: [id number] Date of Birth/Sex: 1933-08-13 (81 y.o. Male) Treating RN: Huel Coventry Primary Care Physician: Maudie Flakes Other Clinician: Referring Physician: Maudie Flakes Treating Physician/Extender: Rudene Re in Treatment: 0 Verbal / Phone Orders: Yes Clinician: Huel Coventry Read Back and Verified: Yes Diagnosis  Coding ICD-10 Coding Code Description E11.622 Type 2 diabetes mellitus with other skin ulcer L89.323 Pressure ulcer of left buttock, stage 3 L89.313 Pressure ulcer of right buttock, stage 3 Wound Cleansing Wound #3 Right Gluteus o Clean wound with Normal Saline. Wound #4 Left Gluteus o Clean wound with Normal Saline. Anesthetic Wound #3 Right Gluteus o Topical Lidocaine 4% cream applied to wound bed prior to debridement Wound #4 Left Gluteus o Topical Lidocaine 4% cream applied to wound bed prior to debridement Primary Wound Dressing Wound #3 Right Gluteus o Prisma Ag Wound #4 Left Gluteus o Prisma Ag Secondary Dressing Wound #3 Right Gluteus o Boardered Foam Dressing Wound #4 Left Gluteus o Boardered Foam Dressing Dressing Change Frequency Wound #3 Right Gluteus Mcintire, Dahmir ([id number]) o Change dressing every other day. Wound #4 Left Gluteus o Change dressing every other day. Follow-up Appointments Wound #3 Right Gluteus o Return Appointment in 1 week. Wound #4 Left Gluteus o Return Appointment in 1 week. Off-Loading Wound #3 Right Gluteus o Turn and reposition every 2 hours o Mattress - Air mattress suggested; patient declined. Wound #4 Left Gluteus o Turn and reposition every 2 hours o Mattress - Air mattress suggested; patient declined. Additional Orders / Instructions Wound #3 Right Gluteus o Increase protein intake. o Activity as tolerated Wound #4 Left Gluteus o Increase protein intake. o Activity as tolerated Electronic Signature(s) Signed: 09/13/2014 3:34:25 PM By: Evlyn Kanner MD, FACS Signed: 09/13/2014 5:20:17 PM By: Elliot Gurney RN, BSN, Kim RN, BSN Entered By: Elliot Gurney, RN, BSN, Kim on 09/13/2014 15:23:47 James [name] ([id number]) -------------------------------------------------------------------------------- Problem List Details Patient Name: James [name] Date of Service: 09/13/2014 2:30 PM Medical Record  Number: [id number] Patient Account Number: [id number] Date of Birth/Sex: January 28, 1934 (81 y.o. Male) Treating RN: Primary Care Physician: Maudie Flakes Other Clinician: Referring Physician: Maudie Flakes Treating Physician/Extender: Rudene Re in Treatment: 0 Active Problems ICD-10 Encounter Code Description Active Date Diagnosis E11.622 Type 2 diabetes mellitus with other skin ulcer 09/13/2014 Yes L89.322 Pressure ulcer of left buttock, stage 2 09/13/2014 Yes L89.312 Pressure ulcer of right buttock, stage 2 09/13/2014 Yes Inactive Problems Resolved Problems Electronic Signature(s) Signed: 09/13/2014 3:29:54 PM By: Evlyn Kanner MD, FACS Previous Signature: 09/13/2014 3:27:40 PM  Version By: Evlyn Kanner MD, FACS Previous Signature: 09/13/2014 2:58:18 PM Version By: Evlyn Kanner MD, FACS Previous Signature: 09/13/2014 2:57:05 PM Version By: Evlyn Kanner MD, FACS Entered By: Evlyn Kanner on 09/13/2014 15:29:54 James [name] ([id number]) -------------------------------------------------------------------------------- Progress Note Details Patient Name: James [name] Date of Service: 09/13/2014 2:30 PM Medical Record Number: [id number] Patient Account Number: [id number] Date of Birth/Sex: 1933/02/19 (81 y.o. Male) Treating RN: Primary Care Physician: Maudie Flakes Other Clinician: Referring Physician: Maudie Flakes Treating Physician/Extender: Rudene Re in Treatment: 0 Subjective Chief Complaint Information obtained from Patient Patient returns to the wound care center for reopened ulcer to: both gluteal areas. He was treated in May 2016 by me and did very well with conservator therapy. History of Present Illness (HPI) The following HPI elements were documented for the patient's wound: Location: Patient presents with an ulcer on the right and left buttock Quality: Patient reports experiencing a dull pain to affected area(s). Severity: Patient states wound are  getting worse. Duration: Patient has had the wound for > 3 months prior to seeking treatment at the wound center Timing: Pain in wound is Intermittent (comes and goes Context: The wound occurred when the patient had a fall and injured himself on his side but he was on his bottom and dragged himself for a while until he could get to a phone. Modifying Factors: he sits in a chair for about 18 hours a day and sleeps about 6 hours. Associated Signs and Symptoms: Patient reports having difficulty standing for long periods. The patient was seen recently by Dr. Maryjane Hurter on 09/07/2014 with a return of the pressure ulcers which he had on the bilateral gluteal regions. His most recent hemoglobin A1c was 5.8 that day. He was known to our wound care center for a similar problem in May 2016 and was seen by me. 81 year old gentleman whose had chronic morbidities including asthma, diabetes mellitus type 2, chronic disease, COPD, hypertension, atrial fibrillation, SVT, CAD, cardiomyopathy, congestive heart failure. His diabetes is diet controlled and he does not have to take any medications for a long while. His last hemoglobin A1c was 5.8. Was a former smoker and has not smoked since 1985.He is not taken any specific treatment for his present problems. during his last visit here he has received air mattress and wheelchair cushion. However he sent it back for possible monetary reasons and has not been using those. He continues to offload as was discussed with him last time but I don't know how diligent he is being. He does walk around a little bit around the house. Wound History Patient presents with 2 open wounds that have been present for approximately 2 mos. Patient has been treating wounds in the following manner: silvadene; lidocaine. The wounds have been healed in the past but have re-opened. Laboratory tests have been performed in the last month. Patient reportedly has not tested positive for an  antibiotic resistant organism. Patient reportedly has not tested positive for osteomyelitis. James [name], James [name] ([id number]) Patient History Information obtained from Patient. Allergies sulfa (Reaction: rash) Family History No family history of Cancer, Diabetes, Heart Disease, Hereditary Spherocytosis, Hypertension, Kidney Disease, Lung Disease, Seizures, Stroke, Thyroid Problems. Social History Former smoker, Marital Status - Married, Alcohol Use - Never, Drug Use - No History, Caffeine Use - Never. Medical History Ear/Nose/Mouth/Throat Patient has history of Chronic sinus problems/congestion Denies history of Middle ear problems Hematologic/Lymphatic Denies history of Anemia, Hemophilia, Human Immunodeficiency Virus, Lymphedema, Sickle Cell Disease Respiratory Denies history of Aspiration, Pneumothorax, Sleep Apnea,  Tuberculosis Cardiovascular Denies history of Angina, Deep Vein Thrombosis, Hypertension, Hypotension, Myocardial Infarction, Peripheral Arterial Disease, Peripheral Venous Disease, Phlebitis Gastrointestinal Denies history of Cirrhosis , Colitis, Crohn s, Hepatitis A, Hepatitis B, Hepatitis C Endocrine Patient has history of Type II Diabetes Denies history of Type I Diabetes Genitourinary Denies history of End Stage Renal Disease Immunological Denies history of Lupus Erythematosus, Raynaud s, Scleroderma Integumentary (Skin) Patient has history of History of pressure wounds Denies history of History of Burn Musculoskeletal Denies history of Gout, Rheumatoid Arthritis, Osteoarthritis, Osteomyelitis Neurologic Denies history of Dementia, Neuropathy, Quadriplegia, Paraplegia, Seizure Disorder Oncologic Denies history of Received Chemotherapy, Received Radiation Psychiatric Denies history of Anorexia/bulimia Patient is treated with Controlled Diet. Blood sugar is not tested. Hospitalization/Surgery History - 05/16/2013, cardiac cath. - 02/05/1994, aortic valve  replacement. Medical And Surgical History Notes James [name], James [name] ([id number]) Constitutional Symptoms (General Health) CHF; COPD; pressure wounds; Type II Diabetes; Cardiovascular Aortic valve disease,SVT, EF 40-45%, hyperlipedrmia Endocrine obesity Review of Systems (ROS) Constitutional Symptoms (General Health) The patient has no complaints or symptoms. Eyes The patient has no complaints or symptoms. Ear/Nose/Mouth/Throat The patient has no complaints or symptoms. Hematologic/Lymphatic The patient has no complaints or symptoms. Respiratory Complains or has symptoms of Shortness of Breath. Cardiovascular The patient has no complaints or symptoms. Gastrointestinal The patient has no complaints or symptoms. Endocrine Denies complaints or symptoms of Hepatitis, Thyroid disease, Polydypsia (Excessive Thirst). Genitourinary The patient has no complaints or symptoms. Immunological The patient has no complaints or symptoms. Integumentary (Skin) Complains or has symptoms of Wounds, Bleeding or bruising tendency, Breakdown. Denies complaints or symptoms of Swelling. Musculoskeletal Denies complaints or symptoms of Muscle Pain, Muscle Weakness. Neurologic The patient has no complaints or symptoms. Oncologic The patient has no complaints or symptoms. Psychiatric Complains or has symptoms of Anxiety. Objective Constitutional Pulse regular. Respirations normal and unlabored. Afebrile. James [name], James [name] ([id number]) Vitals Time Taken: 2:55 PM, Height: 74 in, Source: Stated, Weight: 198 lbs, Source: Stated, BMI: 25.4, Temperature: 97.5 F, Pulse: 81 bpm, Respiratory Rate: 18 breaths/min, Blood Pressure: 119/42 mmHg. Eyes Nonicteric. Reactive to light. Ears, Nose, Mouth, and Throat Lips, teeth, and gums WNL.Marland Kitchen Moist mucosa without lesions . Neck supple and nontender. No palpable supraclavicular or cervical adenopathy. Normal sized without goiter. Respiratory WNL. No retractions..  Breath sounds WNL, No rubs, rales, rhonchi, or wheeze.. Cardiovascular Heart rhythm and rate regular, no murmur or gallop.. Pedal Pulses WNL. No clubbing, cyanosis or edema. Gastrointestinal (GI) Abdomen without masses or tenderness.. No liver or spleen enlargement or tenderness.. Musculoskeletal Adexa without tenderness or enlargement.. Digits and nails w/o clubbing, cyanosis, infection, petechiae, ischemia, or inflammatory conditions.Marland Kitchen Psychiatric Judgement and insight Intact.. No evidence of depression, anxiety, or agitation.. General Notes: He has stage II pressure ulcers both gluteal areas and they are not infected nor do they have surrounding cellulitis. Integumentary (Hair, Skin) No suspicious lesions. No crepitus or fluctuance. No peri-wound warmth or erythema. No masses.. Wound #3 status is Open. Original cause of wound was Pressure Injury. The wound is located on the Right Gluteus. The wound measures 4.3cm length x 2.2cm width x 0.1cm depth; 7.43cm^2 area and 0.743cm^3 volume. The wound is limited to skin breakdown. There is a none present amount of drainage noted. The wound margin is indistinct and nonvisible. There is large (67-100%) pink granulation within the wound bed. There is no necrotic tissue within the wound bed. The periwound skin appearance did not exhibit: Callus, Crepitus, Excoriation, Fluctuance, Friable, Induration, Localized Edema, Rash, Scarring, Dry/Scaly, Maceration, Moist, Atrophie  Blanche, Cyanosis, Ecchymosis, Hemosiderin Staining, Mottled, Pallor, Rubor, Erythema. Wound #4 status is Open. Original cause of wound was Pressure Injury. The wound is located on the Left Gluteus. The wound measures 1.1cm length x 0.7cm width x 0.21cm depth; 0.605cm^2 area and 0.127cm^3 volume. The wound is limited to skin breakdown. There is a none present amount of drainage noted. The wound margin is distinct with the outline attached to the wound base. There is large  (67-100%) granulation within the wound bed. There is no necrotic tissue within the wound bed. The periwound skin appearance did not exhibit: Callus, Crepitus, Excoriation, Fluctuance, Friable, Induration, Localized Edema, Lau, Hayzen ([id number]) Rash, Scarring, Dry/Scaly, Maceration, Moist, Atrophie Blanche, Cyanosis, Ecchymosis, Hemosiderin Staining, Mottled, Pallor, Rubor, Erythema. Assessment Active Problems ICD-10 E11.622 - Type 2 diabetes mellitus with other skin ulcer L89.322 - Pressure ulcer of left buttock, stage 2 L89.312 - Pressure ulcer of right buttock, stage 2 I have recommended Prisma to be applied locally with a bordered foam dressing and continue with aggressive offloading. We have discussed reordering the air mattress and the Roho cushion if he so desires but I think for financial reasons he is going to decline. we have again discussed not sitting in the lift chair for prolonged periods of time and to ambulate and change positions as often as possible. Nutrition and other mineral supplements and vitamins have been discussed with him. He will come back and see as next week. Plan Wound Cleansing: Wound #3 Right Gluteus: Clean wound with Normal Saline. Wound #4 Left Gluteus: Clean wound with Normal Saline. Anesthetic: Wound #3 Right Gluteus: Topical Lidocaine 4% cream applied to wound bed prior to debridement Wound #4 Left Gluteus: Topical Lidocaine 4% cream applied to wound bed prior to debridement Primary Wound Dressing: Wound #3 Right Gluteus: Prisma Ag Wound #4 Left Gluteus: Prisma Ag Secondary Dressing: Wound #3 Right GluteusVICTORIO, James [name] ([id number]) Boardered Foam Dressing Wound #4 Left Gluteus: Boardered Foam Dressing Dressing Change Frequency: Wound #3 Right Gluteus: Change dressing every other day. Wound #4 Left Gluteus: Change dressing every other day. Follow-up Appointments: Wound #3 Right Gluteus: Return Appointment in 1 week. Wound  #4 Left Gluteus: Return Appointment in 1 week. Off-Loading: Wound #3 Right Gluteus: Turn and reposition every 2 hours Mattress - Air mattress suggested; patient declined. Wound #4 Left Gluteus: Turn and reposition every 2 hours Mattress - Air mattress suggested; patient declined. Additional Orders / Instructions: Wound #3 Right Gluteus: Increase protein intake. Activity as tolerated Wound #4 Left Gluteus: Increase protein intake. Activity as tolerated I have recommended Prisma to be applied locally with a bordered foam dressing and continue with aggressive offloading. We have discussed reordering the air mattress and the Roho cushion if he so desires but I think for financial reasons he is going to decline. we have again discussed not sitting in the lift chair for prolonged periods of time and to ambulate and change positions as often as possible. Nutrition and other mineral supplements and vitamins have been discussed with him. He will come back and see as next week. Electronic Signature(s) Signed: 09/13/2014 3:33:50 PM By: Evlyn Kanner MD, FACS Entered By: Evlyn Kanner on 09/13/2014 15:33:50 James [name] ([id number]) -------------------------------------------------------------------------------- ROS/PFSH Details Patient Name: James [name] Date of Service: 09/13/2014 2:30 PM Medical Record Number: [id number] Patient Account Number: [id number] Date of Birth/Sex: 1934-01-09 (81 y.o. Male) Treating RN: Huel Coventry Primary Care Physician: Maudie Flakes Other Clinician: Referring Physician: Maudie Flakes Treating Physician/Extender: Rudene Re in Treatment: 0 Information Obtained From  Patient Wound History Do you currently have one or more open woundso Yes How many open wounds do you currently haveo 2 Approximately how long have you had your woundso 2 mos How have you been treating your wound(s) until nowo silvadene; lidocaine Has your wound(s) ever healed  and then re-openedo Yes Have you had any lab work done in the past montho Yes Who ordered the lab work [facility] Have you tested positive for an antibiotic resistant organism (MRSA, VRE)o No Have you tested positive for osteomyelitis (bone infection)o No Constitutional Symptoms (General Health) Complaints and Symptoms: No Complaints or Symptoms Complaints and Symptoms: Negative for: Fatigue; Fever; Chills; Marked Weight Change Medical History: Past Medical History Notes: CHF; COPD; pressure wounds; Type II Diabetes; Eyes Complaints and Symptoms: No Complaints or Symptoms Complaints and Symptoms: Negative for: Dry Eyes; Vision Changes Medical History: Negative for: Cataracts; Glaucoma; Optic Neuritis Ear/Nose/Mouth/Throat Complaints and Symptoms: No Complaints or Symptoms Complaints and Symptoms: James [name], James [name] ([id number]) Negative for: Difficult clearing ears; Sinusitis Medical History: Positive for: Chronic sinus problems/congestion Negative for: Middle ear problems Hematologic/Lymphatic Complaints and Symptoms: No Complaints or Symptoms Complaints and Symptoms: Negative for: Bleeding / Clotting Disorders; Human Immunodeficiency Virus Medical History: Negative for: Anemia; Hemophilia; Human Immunodeficiency Virus; Lymphedema; Sickle Cell Disease Respiratory Complaints and Symptoms: Positive for: Shortness of Breath Medical History: Positive for: Asthma; Chronic Obstructive Pulmonary Disease (COPD) Negative for: Aspiration; Pneumothorax; Sleep Apnea; Tuberculosis Gastrointestinal Complaints and Symptoms: No Complaints or Symptoms Complaints and Symptoms: Negative for: Frequent diarrhea; Nausea Medical History: Negative for: Cirrhosis ; Colitis; Crohnos; Hepatitis A; Hepatitis B; Hepatitis C Endocrine Complaints and Symptoms: Negative for: Hepatitis; Thyroid disease; Polydypsia (Excessive Thirst) Medical History: Positive for: Type II  Diabetes Negative for: Type I Diabetes Past Medical History Notes: obesity Time with diabetes: 6 years Treated with: Diet Blood sugar tested every day: No Headley, James [name] ([id number]) Genitourinary Complaints and Symptoms: No Complaints or Symptoms Complaints and Symptoms: Negative for: Kidney failure/ Dialysis; Incontinence/dribbling Medical History: Negative for: End Stage Renal Disease Integumentary (Skin) Complaints and Symptoms: Positive for: Wounds; Bleeding or bruising tendency; Breakdown Negative for: Swelling Medical History: Positive for: History of pressure wounds Negative for: History of Burn Musculoskeletal Complaints and Symptoms: Negative for: Muscle Pain; Muscle Weakness Medical History: Negative for: Gout; Rheumatoid Arthritis; Osteoarthritis; Osteomyelitis Psychiatric Complaints and Symptoms: Positive for: Anxiety Medical History: Negative for: Anorexia/bulimia Cardiovascular Complaints and Symptoms: No Complaints or Symptoms Medical History: Positive for: Arrhythmia; Congestive Heart Failure; Coronary Artery Disease Negative for: Angina; Deep Vein Thrombosis; Hypertension; Hypotension; Myocardial Infarction; Peripheral Arterial Disease; Peripheral Venous Disease; Phlebitis Past Medical History Notes: Aortic valve disease,SVT, EF 40-45%, hyperlipedrmia Immunological James [name], James [name] ([id number]) Complaints and Symptoms: No Complaints or Symptoms Medical History: Negative for: Lupus Erythematosus; Raynaudos; Scleroderma Neurologic Complaints and Symptoms: No Complaints or Symptoms Medical History: Negative for: Dementia; Neuropathy; Quadriplegia; Paraplegia; Seizure Disorder Oncologic Complaints and Symptoms: No Complaints or Symptoms Medical History: Negative for: Received Chemotherapy; Received Radiation HBO Extended History Items Ear/Nose/Mouth/Throat: Chronic sinus problems/congestion Hospitalization / Surgery History Name of Hospital  Purpose of Hospitalization/Surgery Date cardiac cath 05/16/2013 aortic valve replacement 02/05/1994 Family and Social History Cancer: No; Diabetes: No; Heart Disease: No; Hereditary Spherocytosis: No; Hypertension: No; Kidney Disease: No; Lung Disease: No; Seizures: No; Stroke: No; Thyroid Problems: No; Former smoker; Marital Status - Married; Alcohol Use: Never; Drug Use: No History; Caffeine Use: Never; Living Will: Yes (Not Provided); Medical Power of Attorney: Yes (Not Provided) Physician Affirmation I have reviewed and agree with the above  information. Electronic Signature(s) Signed: 09/13/2014 3:26:16 PM By: Evlyn Kanner MD, FACS Signed: 09/13/2014 5:20:17 PM By: Elliot Gurney RN, BSN, Kim RN, BSN Entered By: Evlyn Kanner on 09/13/2014 15:26:14 James [name] ([id number]) -------------------------------------------------------------------------------- SuperBill Details Patient Name: James [name] Date of Service: 09/13/2014 Medical Record Number: [id number] Patient Account Number: [id number] Date of Birth/Sex: [birth year] (81 y.o. Male) Treating RN: Primary Care Physician: Maudie Flakes Other Clinician: Referring Physician: Maudie Flakes Treating Physician/Extender: Rudene Re in Treatment: 0 Diagnosis Coding ICD-10 Codes Code Description E11.622 Type 2 diabetes mellitus with other skin ulcer L89.322 Pressure ulcer of left buttock, stage 2 L89.312 Pressure ulcer of right buttock, stage 2 Facility Procedures CPT4 Code: 78295621 Description: 99214 - WOUND CARE VISIT-LEV 4 EST PT Modifier: Quantity: 1 Physician Procedures CPT4 Code: 3086578 Description: 99214 - WC PHYS LEVEL 4 - EST PT ICD-10 Description Diagnosis E11.622 Type 2 diabetes mellitus with other skin ulcer L89.322 Pressure ulcer of left buttock, stage 2 L89.312 Pressure ulcer of right buttock, stage 2 Modifier: Quantity: 1 Electronic Signature(s) Signed: 09/13/2014 3:34:06 PM By: Evlyn Kanner MD,  FACS Entered By: Evlyn Kanner on 09/13/2014 15:34:05

## 2014-09-14 NOTE — Progress Notes (Signed)
James, [name] ([id number]) Visit Report for 09/13/2014 Allergy List Details Patient Name: James [name], James [name] Date of Service: 09/13/2014 2:30 PM Medical Record Number: [id number] Patient Account Number: [id number] Date of Birth/Sex: [birth year] (81 y.o. Male) Treating RN: Huel Coventry Primary Care Physician: Maudie Flakes Other Clinician: Referring Physician: Maudie Flakes Treating Physician/Extender: Rudene Re in Treatment: 0 Allergies Active Allergies sulfa Reaction: rash Allergy Notes Electronic Signature(s) Signed: 09/13/2014 5:20:17 PM By: Elliot Gurney, RN, BSN, Kim RN, BSN Entered By: Elliot Gurney, RN, BSN, Kim on 09/13/2014 14:59:27 James [name] ([id number]) -------------------------------------------------------------------------------- Arrival Information Details Patient Name: James [name] Date of Service: 09/13/2014 2:30 PM Medical Record Number: [id number] Patient Account Number: [id number] Date of Birth/Sex: 02-07-1933 (80 y.o. Male) Treating RN: Huel Coventry Primary Care Physician: Maudie Flakes Other Clinician: Referring Physician: Maudie Flakes Treating Physician/Extender: Rudene Re in Treatment: 0 Visit Information Patient Arrived: Wheel Chair Arrival Time: 14:57 Accompanied By: wife Transfer Assistance: Nurse, adult Patient Identification Verified: Yes Secondary Verification Process Yes Completed: Patient Has Alerts: Yes Patient Alerts: Patient on Blood Thinner Type II Diabetic History Since Last Visit Electronic Signature(s) Signed: 09/13/2014 5:20:17 PM By: Elliot Gurney, RN, BSN, Kim RN, BSN Entered By: Elliot Gurney, RN, BSN, Kim on 09/13/2014 14:58:03 James [name] ([id number]) -------------------------------------------------------------------------------- Clinic Level of Care Assessment Details Patient Name: James [name] Date of Service: 09/13/2014 2:30 PM Medical Record Number: [id number] Patient Account Number: [id number] Date of Birth/Sex: Jun 26, 1933  (81 y.o. Male) Treating RN: Huel Coventry Primary Care Physician: Maudie Flakes Other Clinician: Referring Physician: Maudie Flakes Treating Physician/Extender: Rudene Re in Treatment: 0 Clinic Level of Care Assessment Items TOOL 2 Quantity Score []  - Use when only an EandM is performed on the INITIAL visit 0 ASSESSMENTS - Nursing Assessment / Reassessment X - General Physical Exam (combine w/ comprehensive assessment (listed just 1 20 below) when performed on new pt. evals) X - Comprehensive Assessment (HX, ROS, Risk Assessments, Wounds Hx, etc.) 1 25 ASSESSMENTS - Wound and Skin Assessment / Reassessment []  - Simple Wound Assessment / Reassessment - one wound 0 X - Complex Wound Assessment / Reassessment - multiple wounds 2 5 []  - Dermatologic / Skin Assessment (not related to wound area) 0 ASSESSMENTS - Ostomy and/or Continence Assessment and Care []  - Incontinence Assessment and Management 0 []  - Ostomy Care Assessment and Management (repouching, etc.) 0 PROCESS - Coordination of Care []  - Simple Patient / Family Education for ongoing care 0 X - Complex (extensive) Patient / Family Education for ongoing care 1 20 []  - Staff obtains Chiropractor, Records, Test Results / Process Orders 0 []  - Staff telephones HHA, Nursing Homes / Clarify orders / etc 0 []  - Routine Transfer to another Facility (non-emergent condition) 0 []  - Routine Hospital Admission (non-emergent condition) 0 X - New Admissions / Manufacturing engineer / Ordering NPWT, Apligraf, etc. 1 15 []  - Emergency Hospital Admission (emergent condition) 0 []  - Simple Discharge Coordination 0 Berrian, Sabrina ([id number]) []  - Complex (extensive) Discharge Coordination 0 PROCESS - Special Needs []  - Pediatric / Minor Patient Management 0 []  - Isolation Patient Management 0 []  - Hearing / Language / Visual special needs 0 []  - Assessment of Community assistance (transportation, D/C planning, etc.) 0 []  -  Additional assistance / Altered mentation 0 []  - Support Surface(s) Assessment (bed, cushion, seat, etc.) 0 INTERVENTIONS - Wound Cleansing / Measurement X - Wound Imaging (photographs - any number of wounds) 1 5 []  - Wound Tracing (instead of photographs) 0 []  - Simple Wound Measurement - one wound 0  X - Complex Wound Measurement - multiple wounds 2 5  - Simple Wound Cleansing - one wound 0 X - Complex Wound Cleansing - multiple wounds 2 5 INTERVENTIONS - Wound Dressings  - Small Wound Dressing one or multiple wounds 0 X - Medium Wound Dressing one or multiple wounds 2 15  - Large Wound Dressing one or multiple wounds 0  - Application of Medications - injection 0 INTERVENTIONS - Miscellaneous  - External ear exam 0  - Specimen Collection (cultures, biopsies, blood, body fluids, etc.) 0  - Specimen(s) / Culture(s) sent or taken to Lab for analysis 0  - Patient Transfer (multiple staff / Michiel Sites Lift / Similar devices) 0  - Simple Staple / Suture removal (25 or less) 0  - Complex Staple / Suture removal (26 or more) 0 Llerenas, Linzie ([id number])  - Hypo / Hyperglycemic Management (close monitor of Blood Glucose) 0  - Ankle / Brachial Index (ABI) - do not check if billed separately 0 Has the patient been seen at the hospital within the last three years: Yes Total Score: 145 Level Of Care: New/Established - Level 4 Electronic Signature(s) Signed: 09/13/2014 5:20:17 PM By: Elliot Gurney, RN, BSN, Kim RN, BSN Entered By: Elliot Gurney, RN, BSN, Kim on 09/13/2014 15:25:22 James [name] ([id number]) -------------------------------------------------------------------------------- Encounter Discharge Information Details Patient Name: James [name] Date of Service: 09/13/2014 2:30 PM Medical Record Number: [id number] Patient Account Number: [id number] Date of Birth/Sex: [birth year] (80 y.o. Male) Treating RN: Huel Coventry Primary Care Physician: Maudie Flakes Other  Clinician: Referring Physician: Maudie Flakes Treating Physician/Extender: Rudene Re in Treatment: 0 Encounter Discharge Information Items Discharge Pain Level: 0 Discharge Condition: Stable Ambulatory Status: Wheelchair Discharge Destination: Home Transportation: Private Auto Accompanied By: wife Schedule Follow-up Appointment: Yes Medication Reconciliation completed Yes and provided to Patient/Care Vadhir Mcnay: Provided on Clinical Summary of Care: 09/13/2014 Form Type Recipient Paper Patient MD Electronic Signature(s) Signed: 09/13/2014 3:42:04 PM By: Gwenlyn Perking Entered By: Gwenlyn Perking on 09/13/2014 15:42:04 James [name] ([id number]) -------------------------------------------------------------------------------- Multi Wound Chart Details Patient Name: James [name] Date of Service: 09/13/2014 2:30 PM Medical Record Number: [id number] Patient Account Number: [id number] Date of Birth/Sex: 1933/07/21 (81 y.o. Male) Treating RN: Huel Coventry Primary Care Physician: Maudie Flakes Other Clinician: Referring Physician: Maudie Flakes Treating Physician/Extender: Rudene Re in Treatment: 0 Vital Signs Height(in): 74 Pulse(bpm): 81 Weight(lbs): 198 Blood Pressure 119/42 (mmHg): Body Mass Index(BMI): 25 Temperature(F): 97.5 Respiratory Rate 18 (breaths/min): Wound Assessments Treatment Notes Electronic Signature(s) Signed: 09/13/2014 5:20:17 PM By: Elliot Gurney, RN, BSN, Kim RN, BSN Entered By: Elliot Gurney, RN, BSN, Kim on 09/13/2014 15:10:33 James [name] ([id number]) -------------------------------------------------------------------------------- Multi-Disciplinary Care Plan Details Patient Name: James [name] Date of Service: 09/13/2014 2:30 PM Medical Record Number: [id number] Patient Account Number: [id number] Date of Birth/Sex: [birth year] (80 y.o. Male) Treating RN: Huel Coventry Primary Care Physician: Maudie Flakes Other Clinician: Referring  Physician: Maudie Flakes Treating Physician/Extender: Rudene Re in Treatment: 0 Active Inactive Abuse / Safety / Falls / Self Care Management Nursing Diagnoses: Potential for falls Goals: Patient will remain injury free Date Initiated: 09/13/2014 Goal Status: Active Interventions: Assess fall risk on admission and as needed Notes: Orientation to the Wound Care Program Nursing Diagnoses: Knowledge deficit related to the wound healing center program Goals: Patient/caregiver will verbalize understanding of the Wound Healing Center Program Date Initiated: 09/13/2014 Goal Status: Active Interventions: Provide education on orientation to the wound center Notes: Pressure Nursing Diagnoses: Potential for impaired tissue integrity related to pressure, friction, moisture, and shear Goals: Patient will remain  free of pressure ulcers Date Initiated: 09/13/2014 James [name] ([id number]) Goal Status: Active Interventions: Provide education on pressure ulcers Notes: Wound/Skin Impairment Nursing Diagnoses: Impaired tissue integrity Goals: Ulcer/skin breakdown will heal within 14 weeks Date Initiated: 09/13/2014 Goal Status: Active Interventions: Assess ulceration(s) every visit Notes: Electronic Signature(s) Signed: 09/13/2014 5:20:17 PM By: Elliot Gurney, RN, BSN, Kim RN, BSN Entered By: Elliot Gurney, RN, BSN, Kim on 09/13/2014 15:10:07 James [name] ([id number]) -------------------------------------------------------------------------------- Pain Assessment Details Patient Name: James [name] Date of Service: 09/13/2014 2:30 PM Medical Record Number: [id number] Patient Account Number: [id number] Date of Birth/Sex: January 06, 1934 (80 y.o. Male) Treating RN: Huel Coventry Primary Care Physician: Maudie Flakes Other Clinician: Referring Physician: Maudie Flakes Treating Physician/Extender: Rudene Re in Treatment: 0 Active Problems Location of Pain Severity and Description  of Pain Patient Has Paino Yes Site Locations Pain Location: Pain in Ulcers Duration of the Pain. Constant / Intermittento Intermittent Rate the pain. Current Pain Level: 5 Character of Pain Describe the Pain: Tender Pain Management and Medication Current Pain Management: Medication: No Cold Application: No Rest: No Massage: No Activity: No T.E.N.S.: No Heat Application: No Leg drop or elevation: No Is the Current Pain Management Inadequate Adequate: How does your pain impact your activities of daily livingo Sleep: No Bathing: No Appetite: No Relationship With Others: No Bladder Continence: No Emotions: No Bowel Continence: No Work: No Toileting: No Drive: No Dressing: No Hobbies: No Psychologist, prison and probation services) Signed: 09/13/2014 5:20:17 PM By: Elliot Gurney, RN, BSN, Kim RN, BSN Patmon, Demian ([id number]) Entered By: Elliot Gurney, RN, BSN, Kim on 09/13/2014 14:58:26 James [name] ([id number]) -------------------------------------------------------------------------------- Patient/Caregiver Education Details Patient Name: James [name] Date of Service: 09/13/2014 2:30 PM Medical Record Number: [id number] Patient Account Number: [id number] Date of Birth/Gender: [birth year] (80 y.o. Male) Treating RN: Huel Coventry Primary Care Physician: Maudie Flakes Other Clinician: Referring Physician: Maudie Flakes Treating Physician/Extender: Rudene Re in Treatment: 0 Education Assessment Education Provided To: Patient and Caregiver Education Topics Provided Wound/Skin Impairment: Handouts: Other: turn every two hours Electronic Signature(s) Signed: 09/13/2014 5:20:17 PM By: Elliot Gurney, RN, BSN, Kim RN, BSN Entered By: Elliot Gurney, RN, BSN, Kim on 09/13/2014 15:28:07 James [name] ([id number]) -------------------------------------------------------------------------------- Wound Assessment Details Patient Name: James [name] Date of Service: 09/13/2014 2:30 PM Medical Record Number:  [id number] Patient Account Number: [id number] Date of Birth/Sex: 12/05/1933 (80 y.o. Male) Treating RN: Huel Coventry Primary Care Physician: Maudie Flakes Other Clinician: Referring Physician: Maudie Flakes Treating Physician/Extender: Rudene Re in Treatment: 0 Wound Status Wound Number: 3 Primary Pressure Ulcer Etiology: Wound Location: Right Gluteus Wound Open Wounding Event: Pressure Injury Status: Date Acquired: 08/06/2014 Comorbid Chronic sinus problems/congestion, Weeks Of Treatment: 0 History: Asthma, Chronic Obstructive Pulmonary Clustered Wound: No Disease (COPD), Arrhythmia, Congestive Heart Failure, Coronary Artery Disease, Type II Diabetes, History of pressure wounds Photos Photo Uploaded By: Elliot Gurney, RN, BSN, Kim on 09/13/2014 17:28:47 Wound Measurements Length: (cm) 4.3 Width: (cm) 2.2 Depth: (cm) 0.1 Area: (cm) 7.43 Volume: (cm) 0.743 % Reduction in Area: 0% % Reduction in Volume: 0% Wound Description Classification: Category/Stage II Wound Margin: Indistinct, nonvisible Exudate Amount: None Present Wound Bed Granulation Amount: Large (67-100%) Exposed Structure Granulation Quality: Pink Fascia Exposed: No Necrotic Amount: None Present (0%) Fat Layer Exposed: No Constante, Kellen ([id number]) Tendon Exposed: No Muscle Exposed: No Joint Exposed: No Bone Exposed: No Limited to Skin Breakdown Periwound Skin Texture Texture Color No Abnormalities Noted: No No Abnormalities Noted: No Callus: No Atrophie Blanche: No Crepitus: No Cyanosis: No Excoriation: No Ecchymosis: No Fluctuance: No Erythema: No Friable: No Hemosiderin Staining:  No Induration: No Mottled: No Localized Edema: No Pallor: No Rash: No Rubor: No Scarring: No Moisture No Abnormalities Noted: No Dry / Scaly: No Maceration: No Moist: No Wound Preparation Ulcer Cleansing: Rinsed/Irrigated with Saline Topical Anesthetic Applied: Other: lidocaine 4%, Treatment  Notes Wound #3 (Right Gluteus) 1. Cleansed with: Clean wound with Normal Saline 2. Anesthetic Topical Lidocaine 4% cream to wound bed prior to debridement 4. Dressing Applied: Prisma Ag 5. Secondary Dressing Applied Bordered Foam Dressing Electronic Signature(s) Signed: 09/13/2014 5:20:17 PM By: Elliot Gurney, RN, BSN, Kim RN, BSN Entered By: Elliot Gurney, RN, BSN, Kim on 09/13/2014 15:13:39 James [name] ([id number]) -------------------------------------------------------------------------------- Wound Assessment Details Patient Name: James [name] Date of Service: 09/13/2014 2:30 PM Medical Record Number: [id number] Patient Account Number: [id number] Date of Birth/Sex: Jul 21, 1933 (80 y.o. Male) Treating RN: Huel Coventry Primary Care Physician: Maudie Flakes Other Clinician: Referring Physician: Maudie Flakes Treating Physician/Extender: Rudene Re in Treatment: 0 Wound Status Wound Number: 4 Primary Pressure Ulcer Etiology: Wound Location: Left Gluteus Wound Open Wounding Event: Pressure Injury Status: Date Acquired: 08/06/2014 Comorbid Chronic sinus problems/congestion, Weeks Of Treatment: 0 History: Asthma, Chronic Obstructive Pulmonary Clustered Wound: No Disease (COPD), Arrhythmia, Congestive Heart Failure, Coronary Artery Disease, Type II Diabetes, History of pressure wounds Photos Photo Uploaded By: Elliot Gurney, RN, BSN, Kim on 09/13/2014 17:29:33 Wound Measurements Length: (cm) 1.1 Width: (cm) 0.7 Depth: (cm) 0.21 Area: (cm) 0.605 Volume: (cm) 0.127 % Reduction in Area: 0% % Reduction in Volume: 0% Wound Description Classification: Category/Stage II Wound Margin: Distinct, outline attached Exudate Amount: None Present Wound Bed Granulation Amount: Large (67-100%) Exposed Structure Necrotic Amount: None Present (0%) Fascia Exposed: No Fat Layer Exposed: No Wilmes, Keino ([id number]) Tendon Exposed: No Muscle Exposed: No Joint Exposed: No Bone Exposed:  No Limited to Skin Breakdown Periwound Skin Texture Texture Color No Abnormalities Noted: No No Abnormalities Noted: No Callus: No Atrophie Blanche: No Crepitus: No Cyanosis: No Excoriation: No Ecchymosis: No Fluctuance: No Erythema: No Friable: No Hemosiderin Staining: No Induration: No Mottled: No Localized Edema: No Pallor: No Rash: No Rubor: No Scarring: No Moisture No Abnormalities Noted: No Dry / Scaly: No Maceration: No Moist: No Wound Preparation Ulcer Cleansing: Rinsed/Irrigated with Saline Topical Anesthetic Applied: Other: Lidocaine 4%, Treatment Notes Wound #4 (Left Gluteus) 1. Cleansed with: Clean wound with Normal Saline 2. Anesthetic Topical Lidocaine 4% cream to wound bed prior to debridement 4. Dressing Applied: Prisma Ag 5. Secondary Dressing Applied Bordered Foam Dressing Electronic Signature(s) Signed: 09/13/2014 5:20:17 PM By: Elliot Gurney, RN, BSN, Kim RN, BSN Entered By: Elliot Gurney, RN, BSN, Kim on 09/13/2014 15:14:45 James [name] ([id number]) -------------------------------------------------------------------------------- Vitals Details Patient Name: James [name] Date of Service: 09/13/2014 2:30 PM Medical Record Number: [id number] Patient Account Number: [id number] Date of Birth/Sex: [birth year] (80 y.o. Male) Treating RN: Huel Coventry Primary Care Physician: Maudie Flakes Other Clinician: Referring Physician: Maudie Flakes Treating Physician/Extender: Rudene Re in Treatment: 0 Vital Signs Time Taken: 14:55 Temperature (F): 97.5 Height (in): 74 Pulse (bpm): 81 Source: Stated Respiratory Rate (breaths/min): 18 Weight (lbs): 198 Blood Pressure (mmHg): 119/42 Source: Stated Reference Range: 80 - 120 mg / dl Body Mass Index (BMI): 25.4 Electronic Signature(s) Signed: 09/13/2014 5:20:17 PM By: Elliot Gurney, RN, BSN, Kim RN, BSN Entered By: Elliot Gurney, RN, BSN, Kim on 09/13/2014 14:59:16

## 2014-09-14 NOTE — Progress Notes (Signed)
FRIEDRICH, HARRIOTT ([id number]) Visit Report for 09/13/2014 Abuse/Suicide Risk Screen Details Patient Name: James [name], James [name] Date of Service: 09/13/2014 2:30 PM Medical Record Number: [id number] Patient Account Number: [id number] Date of Birth/Sex: [birth year] (81 y.o. Male) Treating RN: Huel Coventry Primary Care Physician: Maudie Flakes Other Clinician: Referring Physician: Maudie Flakes Treating Physician/Extender: Rudene Re in Treatment: 0 Abuse/Suicide Risk Screen Items Answer ABUSE/SUICIDE RISK SCREEN: Has anyone close to you tried to hurt or harm you recentlyo No Do you feel uncomfortable with anyone in your familyo No Has anyone forced you do things that you didnot want to doo No Do you have any thoughts of harming yourselfo No Electronic Signature(s) Signed: 09/13/2014 5:20:17 PM By: Elliot Gurney, RN, BSN, Kim RN, BSN Entered By: Elliot Gurney, RN, BSN, Kim on 09/13/2014 15:07:44 James [name] ([id number]) -------------------------------------------------------------------------------- Activities of Daily Living Details Patient Name: James [name] Date of Service: 09/13/2014 2:30 PM Medical Record Number: [id number] Patient Account Number: [id number] Date of Birth/Sex: 1933/02/08 (80 y.o. Male) Treating RN: Huel Coventry Primary Care Physician: Maudie Flakes Other Clinician: Referring Physician: Maudie Flakes Treating Physician/Extender: Rudene Re in Treatment: 0 Activities of Daily Living Items Answer Activities of Daily Living (Please select one for each item) Drive Automobile Not Able Take Medications Need Assistance Use Telephone Need Assistance Care for Appearance Need Assistance Use Toilet Need Assistance Bath / Shower Need Assistance Dress Self Need Assistance Feed Self Need Assistance Walk Need Assistance Get In / Out Bed Need Assistance Housework Need Assistance Prepare Meals Need Assistance Handle Money Need Assistance Shop for Self Need  Assistance Electronic Signature(s) Signed: 09/13/2014 5:20:17 PM By: Elliot Gurney, RN, BSN, Kim RN, BSN Entered By: Elliot Gurney, RN, BSN, Kim on 09/13/2014 15:07:59 James [name] ([id number]) -------------------------------------------------------------------------------- Education Assessment Details Patient Name: James [name] Date of Service: 09/13/2014 2:30 PM Medical Record Number: [id number] Patient Account Number: [id number] Date of Birth/Sex: March 08, 1933 (80 y.o. Male) Treating RN: Huel Coventry Primary Care Physician: Maudie Flakes Other Clinician: Referring Physician: Maudie Flakes Treating Physician/Extender: Rudene Re in Treatment: 0 Learning Preferences/Education Level/Primary Language Learning Preference: Explanation, Demonstration, Printed Material Preferred Language: English Cognitive Barrier Assessment/Beliefs Language Barrier: No Translator Needed: No Physical Barrier Assessment Impaired Vision: Yes Glasses Impaired Hearing: No Decreased Hand dexterity: No Knowledge/Comprehension Assessment Knowledge Level: High Comprehension Level: High Ability to understand written High instructions: Ability to understand verbal High instructions: Motivation Assessment Anxiety Level: Calm Cooperation: Cooperative Education Importance: Acknowledges Need Interest in Health Problems: Asks Questions Perception: Coherent Willingness to Engage in Self- High Management Activities: Readiness to Engage in Self- High Management Activities: Psychologist, prison and probation services) Signed: 09/13/2014 5:20:17 PM By: Elliot Gurney, RN, BSN, Kim RN, BSN Entered By: Elliot Gurney, RN, BSN, Kim on 09/13/2014 15:08:34 James [name] ([id number]) -------------------------------------------------------------------------------- Fall Risk Assessment Details Patient Name: James [name] Date of Service: 09/13/2014 2:30 PM Medical Record Number: [id number] Patient Account Number: [id number] Date of Birth/Sex: [birth year]  (80 y.o. Male) Treating RN: Huel Coventry Primary Care Physician: Maudie Flakes Other Clinician: Referring Physician: Maudie Flakes Treating Physician/Extender: Rudene Re in Treatment: 0 Fall Risk Assessment Items FALL RISK ASSESSMENT: History of falling - immediate or within 3 months 0 No Secondary diagnosis 15 Yes Ambulatory aid None/bed rest/wheelchair/nurse 0 Yes Crutches/cane/walker 0 No Furniture 0 No IV Access/Saline Lock 0 No Gait/Training Normal/bed rest/immobile 0 Yes Weak 0 No Impaired 0 No Mental Status Oriented to own ability 0 No Electronic Signature(s) Signed: 09/13/2014 5:20:17 PM By: Elliot Gurney, RN, BSN, Kim RN, BSN Entered By: Elliot Gurney, RN, BSN, Kim on 09/13/2014 15:08:52 James [name] ([id number]) -------------------------------------------------------------------------------- Nutrition  Risk Assessment Details Patient Name: James [name], James [name] Date of Service: 09/13/2014 2:30 PM Medical Record Number: [id number] Patient Account Number: [id number] Date of Birth/Sex: May 23, 1933 (81 y.o. Male) Treating RN: Huel Coventry Primary Care Physician: Maudie Flakes Other Clinician: Referring Physician: Maudie Flakes Treating Physician/Extender: Rudene Re in Treatment: 0 Height (in): 74 Weight (lbs): 198 Body Mass Index (BMI): 25.4 Nutrition Risk Assessment Items NUTRITION RISK SCREEN: I have an illness or condition that made me change the kind and/or 0 No amount of food I eat I eat fewer than two meals per day 0 No I eat few fruits and vegetables, or milk products 0 No I have three or more drinks of beer, liquor or wine almost every day 0 No I have tooth or mouth problems that make it hard for me to eat 0 No I don't always have enough money to buy the food I need 0 No I eat alone most of the time 0 No I take three or more different prescribed or over-the-counter drugs a 0 No day Without wanting to, I have lost or gained 10 pounds in the last  six 0 No months I am not always physically able to shop, cook and/or feed myself 0 No Nutrition Protocols Good Risk Protocol 0 No interventions needed Moderate Risk Protocol Electronic Signature(s) Signed: 09/13/2014 5:20:17 PM By: Elliot Gurney, RN, BSN, Kim RN, BSN Entered By: Elliot Gurney, RN, BSN, Kim on 09/13/2014 15:09:04

## 2014-09-21 ENCOUNTER — Encounter: Payer: Medicare Other | Admitting: Surgery

## 2014-09-21 DIAGNOSIS — L89322 Pressure ulcer of left buttock, stage 2: Secondary | ICD-10-CM | POA: Diagnosis not present

## 2014-09-21 NOTE — Progress Notes (Addendum)
James, [name] ([id number]) Visit Report for 09/21/2014 Chief Complaint Document Details Patient Name: James [name], James [name] Date of Service: 09/21/2014 10:15 AM Medical Record Number: [id number] Patient Account Number: [id number] Date of Birth/Sex: February 26, 1933 (81 y.o. Male) Treating RN: Curtis Sites Primary Care Physician: Maudie Flakes Other Clinician: Referring Physician: Maudie Flakes Treating Physician/Extender: Rudene Re in Treatment: 1 Information Obtained from: Patient Chief Complaint Patient returns to the wound care center for reopened ulcer to: Both gluteal areas. He was treated in May 2016 by me and did very well with conservator therapy. Electronic Signature(s) Signed: 09/21/2014 10:56:10 AM By: Evlyn Kanner MD, FACS Entered By: Evlyn Kanner on 09/21/2014 10:56:10 James [name] ([id number]) -------------------------------------------------------------------------------- HPI Details Patient Name: James [name] Date of Service: 09/21/2014 10:15 AM Medical Record Number: [id number] Patient Account Number: [id number] Date of Birth/Sex: Feb 27, 1933 (81 y.o. Male) Treating RN: Curtis Sites Primary Care Physician: Maudie Flakes Other Clinician: Referring Physician: Maudie Flakes Treating Physician/Extender: Rudene Re in Treatment: 1 History of Present Illness Location: Patient presents with an ulcer on the right and left buttock Quality: Patient reports experiencing a dull pain to affected area(s). Severity: Patient states wound are getting worse. Duration: Patient has had the wound for > 3 months prior to seeking treatment at the wound center Timing: Pain in wound is Intermittent (comes and goes Context: The wound occurred when the patient had a fall and injured himself on his side but he was on his bottom and dragged himself for a while until he could get to a phone. Modifying Factors: he sits in a chair for about 18 hours a day and sleeps  about 6 hours. Associated Signs and Symptoms: Patient reports having difficulty standing for long periods. HPI Description: The patient was seen recently by Dr. Maryjane Hurter on 09/07/2014 with a return of the pressure ulcers which he had on the bilateral gluteal regions. His most recent hemoglobin A1c was 5.8 that day. He was known to our wound care center for a similar problem in May 2016 and was seen by me. 81 year old gentleman whose had chronic morbidities including asthma, diabetes mellitus type 2, chronic disease, COPD, hypertension, atrial fibrillation, SVT, CAD, cardiomyopathy, congestive heart failure. His diabetes is diet controlled and he does not have to take any medications for a long while. His last hemoglobin A1c was 5.8. Was a former smoker and has not smoked since 1985.He is not taken any specific treatment for his present problems. during his last visit here he has received air mattress and wheelchair cushion. However he sent it back for possible monetary reasons and has not been using those. He continues to offload as was discussed with him last time but I don't know how diligent he is being. He does walk around a little bit around the house. Electronic Signature(s) Signed: 09/21/2014 10:56:19 AM By: Evlyn Kanner MD, FACS Entered By: Evlyn Kanner on 09/21/2014 10:56:19 James [name] ([id number]) -------------------------------------------------------------------------------- Physical Exam Details Patient Name: James [name] Date of Service: 09/21/2014 10:15 AM Medical Record Number: [id number] Patient Account Number: [id number] Date of Birth/Sex: Mar 04, 1933 (81 y.o. Male) Treating RN: Curtis Sites Primary Care Physician: Maudie Flakes Other Clinician: Referring Physician: Maudie Flakes Treating Physician/Extender: Rudene Re in Treatment: 1 Constitutional . Pulse regular. Respirations normal and unlabored. Afebrile. . Eyes Nonicteric. Reactive to  light. Ears, Nose, Mouth, and Throat Lips, teeth, and gums WNL.Marland Kitchen Moist mucosa without lesions . Neck supple and nontender. No palpable supraclavicular or cervical adenopathy. Normal sized without goiter. Respiratory WNL. No retractions.. Cardiovascular Pedal Pulses WNL.  No clubbing, cyanosis or edema. Lymphatic No adneopathy. No adenopathy. No adenopathy. Musculoskeletal Adexa without tenderness or enlargement.. Digits and nails w/o clubbing, cyanosis, infection, petechiae, ischemia, or inflammatory conditions.. Integumentary (Hair, Skin) No suspicious lesions. No crepitus or fluctuance. No peri-wound warmth or erythema. No masses.Marland Kitchen Psychiatric Judgement and insight Intact.. No evidence of depression, anxiety, or agitation.. Notes The wounds by themselves are a bit drier today and there is healthy granulation tissue. No debridement required. Electronic Signature(s) Signed: 09/21/2014 10:56:57 AM By: Evlyn Kanner MD, FACS Entered By: Evlyn Kanner on 09/21/2014 10:56:56 James [name] ([id number]) -------------------------------------------------------------------------------- Physician Orders Details Patient Name: James [name] Date of Service: 09/21/2014 10:15 AM Medical Record Number: [id number] Patient Account Number: [id number] Date of Birth/Sex: [birth year] (81 y.o. Male) Treating RN: Curtis Sites Primary Care Physician: Maudie Flakes Other Clinician: Referring Physician: Maudie Flakes Treating Physician/Extender: Rudene Re in Treatment: 1 Verbal / Phone Orders: Yes Clinician: Curtis Sites Read Back and Verified: Yes Diagnosis Coding ICD-10 Coding Code Description E11.622 Type 2 diabetes mellitus with other skin ulcer L89.322 Pressure ulcer of left buttock, stage 2 L89.312 Pressure ulcer of right buttock, stage 2 Wound Cleansing Wound #3 Right Gluteus o Clean wound with Normal Saline. Wound #4 Left Gluteus o Clean wound with Normal  Saline. Anesthetic Wound #3 Right Gluteus o Topical Lidocaine 4% cream applied to wound bed prior to debridement Wound #4 Left Gluteus o Topical Lidocaine 4% cream applied to wound bed prior to debridement Primary Wound Dressing Wound #3 Right Gluteus o Prisma Ag Wound #4 Left Gluteus o Prisma Ag Secondary Dressing Wound #3 Right Gluteus o Boardered Foam Dressing Wound #4 Left Gluteus o Boardered Foam Dressing Dressing Change Frequency Wound #3 Right Gluteus Bezio, Verner ([id number]) o Change dressing every other day. Wound #4 Left Gluteus o Change dressing every other day. Follow-up Appointments Wound #3 Right Gluteus o Return Appointment in 1 week. Wound #4 Left Gluteus o Return Appointment in 1 week. Off-Loading Wound #3 Right Gluteus o Turn and reposition every 2 hours o Mattress - Air mattress suggested; patient declined. Wound #4 Left Gluteus o Turn and reposition every 2 hours o Mattress - Air mattress suggested; patient declined. Additional Orders / Instructions Wound #3 Right Gluteus o Increase protein intake. o Activity as tolerated Wound #4 Left Gluteus o Increase protein intake. o Activity as tolerated Electronic Signature(s) Signed: 09/21/2014 12:24:58 PM By: Evlyn Kanner MD, FACS Signed: 09/21/2014 4:16:34 PM By: Curtis Sites Entered By: Curtis Sites on 09/21/2014 10:54:40 James [name] ([id number]) -------------------------------------------------------------------------------- Problem List Details Patient Name: James [name] Date of Service: 09/21/2014 10:15 AM Medical Record Number: [id number] Patient Account Number: [id number] Date of Birth/Sex: 1933/10/01 (81 y.o. Male) Treating RN: Curtis Sites Primary Care Physician: Maudie Flakes Other Clinician: Referring Physician: Maudie Flakes Treating Physician/Extender: Rudene Re in Treatment: 1 Active Problems ICD-10 Encounter Code  Description Active Date Diagnosis E11.622 Type 2 diabetes mellitus with other skin ulcer 09/13/2014 Yes L89.322 Pressure ulcer of left buttock, stage 2 09/13/2014 Yes L89.312 Pressure ulcer of right buttock, stage 2 09/13/2014 Yes Inactive Problems Resolved Problems Electronic Signature(s) Signed: 09/21/2014 10:46:44 AM By: Evlyn Kanner MD, FACS Entered By: Evlyn Kanner on 09/21/2014 10:46:44 James [name] ([id number]) -------------------------------------------------------------------------------- Progress Note Details Patient Name: James [name] Date of Service: 09/21/2014 10:15 AM Medical Record Number: [id number] Patient Account Number: [id number] Date of Birth/Sex: July 12, 1933 (81 y.o. Male) Treating RN: Curtis Sites Primary Care Physician: Maudie Flakes Other Clinician: Referring Physician: Maudie Flakes Treating Physician/Extender: Rudene Re in Treatment: 1 Subjective  Chief Complaint Information obtained from Patient Patient returns to the wound care center for reopened ulcer to: Both gluteal areas. He was treated in May 2016 by me and did very well with conservator therapy. History of Present Illness (HPI) The following HPI elements were documented for the patient's wound: Location: Patient presents with an ulcer on the right and left buttock Quality: Patient reports experiencing a dull pain to affected area(s). Severity: Patient states wound are getting worse. Duration: Patient has had the wound for > 3 months prior to seeking treatment at the wound center Timing: Pain in wound is Intermittent (comes and goes Context: The wound occurred when the patient had a fall and injured himself on his side but he was on his bottom and dragged himself for a while until he could get to a phone. Modifying Factors: he sits in a chair for about 18 hours a day and sleeps about 6 hours. Associated Signs and Symptoms: Patient reports having difficulty standing for long  periods. The patient was seen recently by Dr. Maryjane Hurter on 09/07/2014 with a return of the pressure ulcers which he had on the bilateral gluteal regions. His most recent hemoglobin A1c was 5.8 that day. He was known to our wound care center for a similar problem in May 2016 and was seen by me. 81 year old gentleman whose had chronic morbidities including asthma, diabetes mellitus type 2, chronic disease, COPD, hypertension, atrial fibrillation, SVT, CAD, cardiomyopathy, congestive heart failure. His diabetes is diet controlled and he does not have to take any medications for a long while. His last hemoglobin A1c was 5.8. Was a former smoker and has not smoked since 1985.He is not taken any specific treatment for his present problems. during his last visit here he has received air mattress and wheelchair cushion. However he sent it back for possible monetary reasons and has not been using those. He continues to offload as was discussed with him last time but I don't know how diligent he is being. He does walk around a little bit around the house. Objective Hosick, Dre ([id number]) Constitutional Pulse regular. Respirations normal and unlabored. Afebrile. Vitals Time Taken: 10:20 AM, Height: 74 in, Weight: 198 lbs, BMI: 25.4, Temperature: 97.8 F, Pulse: 74 bpm, Respiratory Rate: 18 breaths/min, Blood Pressure: 102/54 mmHg. Eyes Nonicteric. Reactive to light. Ears, Nose, Mouth, and Throat Lips, teeth, and gums WNL.Marland Kitchen Moist mucosa without lesions . Neck supple and nontender. No palpable supraclavicular or cervical adenopathy. Normal sized without goiter. Respiratory WNL. No retractions.. Cardiovascular Pedal Pulses WNL. No clubbing, cyanosis or edema. Lymphatic No adneopathy. No adenopathy. No adenopathy. Musculoskeletal Adexa without tenderness or enlargement.. Digits and nails w/o clubbing, cyanosis, infection, petechiae, ischemia, or inflammatory  conditions.Marland Kitchen Psychiatric Judgement and insight Intact.. No evidence of depression, anxiety, or agitation.. General Notes: The wounds by themselves are a bit drier today and there is healthy granulation tissue. No debridement required. Integumentary (Hair, Skin) No suspicious lesions. No crepitus or fluctuance. No peri-wound warmth or erythema. No masses.. Wound #3 status is Open. Original cause of wound was Pressure Injury. The wound is located on the Right Gluteus. The wound measures 1.5cm length x 1cm width x 0.1cm depth; 1.178cm^2 area and 0.118cm^3 volume. The wound is limited to skin breakdown. There is no tunneling or undermining noted. There is a none present amount of drainage noted. The wound margin is indistinct and nonvisible. There is large (67- 100%) pink granulation within the wound bed. There is no necrotic tissue within the wound bed. The  periwound skin appearance did not exhibit: Callus, Crepitus, Excoriation, Fluctuance, Friable, Induration, Localized Edema, Rash, Scarring, Dry/Scaly, Maceration, Moist, Atrophie Blanche, Cyanosis, Ecchymosis, Hemosiderin Staining, Mottled, Pallor, Rubor, Erythema. Periwound temperature was noted as No Abnormality. The periwound has tenderness on palpation. Wound #4 status is Open. Original cause of wound was Pressure Injury. The wound is located on the Left Beazer, Kinley ([id number]) Gluteus. The wound measures 0.3cm length x 0.3cm width x 0.1cm depth; 0.071cm^2 area and 0.007cm^3 volume. The wound is limited to skin breakdown. There is no tunneling or undermining noted. There is a none present amount of drainage noted. The wound margin is distinct with the outline attached to the wound base. There is large (67-100%) granulation within the wound bed. There is no necrotic tissue within the wound bed. The periwound skin appearance did not exhibit: Callus, Crepitus, Excoriation, Fluctuance, Friable, Induration, Localized Edema, Rash, Scarring,  Dry/Scaly, Maceration, Moist, Atrophie Blanche, Cyanosis, Ecchymosis, Hemosiderin Staining, Mottled, Pallor, Rubor, Erythema. Periwound temperature was noted as No Abnormality. The periwound has tenderness on palpation. Assessment Active Problems ICD-10 E11.622 - Type 2 diabetes mellitus with other skin ulcer L89.322 - Pressure ulcer of left buttock, stage 2 L89.312 - Pressure ulcer of right buttock, stage 2 The patient is using a poor quality foam cushion for his wheelchair and again we have discussed the use of a Roho cushion. I have recommended Prisma to be applied locally with a bordered foam dressing and continue with aggressive offloading. We have discussed reordering the air mattress and the Roho cushion if he so desires but I think for financial reasons he is going to decline. We have again discussed not sitting in the lift chair for prolonged periods of time and to ambulate and change positions as often as possible. Nutrition and other mineral supplements and vitamins have been discussed with him. He will come back and see as next week. Plan Wound Cleansing: Wound #3 Right Gluteus: Clean wound with Normal Saline. Wound #4 Left Gluteus: Clean wound with Normal Saline. Anesthetic: Wound #3 Right Gluteus: Topical Lidocaine 4% cream applied to wound bed prior to debridement ZARED, KNOTH ([id number]) Wound #4 Left Gluteus: Topical Lidocaine 4% cream applied to wound bed prior to debridement Primary Wound Dressing: Wound #3 Right Gluteus: Prisma Ag Wound #4 Left Gluteus: Prisma Ag Secondary Dressing: Wound #3 Right Gluteus: Boardered Foam Dressing Wound #4 Left Gluteus: Boardered Foam Dressing Dressing Change Frequency: Wound #3 Right Gluteus: Change dressing every other day. Wound #4 Left Gluteus: Change dressing every other day. Follow-up Appointments: Wound #3 Right Gluteus: Return Appointment in 1 week. Wound #4 Left Gluteus: Return Appointment in 1  week. Off-Loading: Wound #3 Right Gluteus: Turn and reposition every 2 hours Mattress - Air mattress suggested; patient declined. Wound #4 Left Gluteus: Turn and reposition every 2 hours Mattress - Air mattress suggested; patient declined. Additional Orders / Instructions: Wound #3 Right Gluteus: Increase protein intake. Activity as tolerated Wound #4 Left Gluteus: Increase protein intake. Activity as tolerated The patient is using a poor quality foam cushion for his wheelchair and again we have discussed the use of a Roho cushion. I have recommended Prisma to be applied locally with a bordered foam dressing and continue with aggressive offloading. We have discussed reordering the air mattress and the Roho cushion if he so desires but I think for financial reasons he is going to decline. We have again discussed not sitting in the lift chair for prolonged periods of Raver, Geddy ([id number]) time and to ambulate and  change positions as often as possible. Nutrition and other mineral supplements and vitamins have been discussed with him. He will come back and see as next week. Electronic Signature(s) Signed: 09/21/2014 10:58:21 AM By: Evlyn Kanner MD, FACS Entered By: Evlyn Kanner on 09/21/2014 10:58:20 James [name] ([id number]) -------------------------------------------------------------------------------- SuperBill Details Patient Name: James [name] Date of Service: 09/21/2014 Medical Record Number: [id number] Patient Account Number: [id number] Date of Birth/Sex: 05/04/1933 (81 y.o. Male) Treating RN: Curtis Sites Primary Care Physician: Maudie Flakes Other Clinician: Referring Physician: Maudie Flakes Treating Physician/Extender: Rudene Re in Treatment: 1 Diagnosis Coding ICD-10 Codes Code Description [id number] Type 2 diabetes mellitus with other skin ulcer L89.322 Pressure ulcer of left buttock, stage 2 L89.312 Pressure ulcer of right buttock,  stage 2 Facility Procedures CPT4 Code: [id number] Description: 99213 - WOUND CARE VISIT-LEV 3 EST PT Modifier: Quantity: 1 Physician Procedures CPT4 Code: [id number] Description: 99213 - WC PHYS LEVEL 3 - EST PT ICD-10 Description Diagnosis E11.622 Type 2 diabetes mellitus with other skin ulcer L89.322 Pressure ulcer of left buttock, stage 2 L89.312 Pressure ulcer of right buttock, stage 2 Modifier: Quantity: 1 Electronic Signature(s) Signed: 09/21/2014 10:58:35 AM By: Evlyn Kanner MD, FACS Entered By: Evlyn Kanner on 09/21/2014 10:58:35

## 2014-09-22 NOTE — Progress Notes (Signed)
OMA, ALPERT ([id number]) Visit Report for 09/21/2014 Arrival Information Details Patient Name: James [name], James [name] Date of Service: 09/21/2014 10:15 AM Medical Record Number: [id number] Patient Account Number: [id number] Date of Birth/Sex: 1933-10-20 (81 y.o. Male) Treating RN: Curtis Sites Primary Care Physician: Maudie Flakes Other Clinician: Referring Physician: Maudie Flakes Treating Physician/Extender: Rudene Re in Treatment: 1 Visit Information History Since Last Visit Added or deleted any medications: No Patient Arrived: Wheel Chair Any new allergies or adverse reactions: No Arrival Time: 10:19 Had a fall or experienced change in No Accompanied By: spouse activities of daily living that may affect Transfer Assistance: Manual risk of falls: Patient Identification Verified: Yes Signs or symptoms of abuse/neglect since last No Secondary Verification Process Yes visito Completed: Hospitalized since last visit: No Patient Has Alerts: Yes Pain Present Now: No Patient Alerts: Patient on Blood Thinner Type II Diabetic Electronic Signature(s) Signed: 09/21/2014 4:16:34 PM By: Curtis Sites Entered By: Curtis Sites on 09/21/2014 10:19:26 James [name] ([id number]) -------------------------------------------------------------------------------- Clinic Level of Care Assessment Details Patient Name: James [name] Date of Service: 09/21/2014 10:15 AM Medical Record Number: [id number] Patient Account Number: [id number] Date of Birth/Sex: 08-20-1933 (80 y.o. Male) Treating RN: Curtis Sites Primary Care Physician: Maudie Flakes Other Clinician: Referring Physician: Maudie Flakes Treating Physician/Extender: Rudene Re in Treatment: 1 Clinic Level of Care Assessment Items TOOL 4 Quantity Score []  - Use when only an EandM is performed on FOLLOW-UP visit 0 ASSESSMENTS - Nursing Assessment / Reassessment X - Reassessment of Co-morbidities  (includes updates in patient status) 1 10 X - Reassessment of Adherence to Treatment Plan 1 5 ASSESSMENTS - Wound and Skin Assessment / Reassessment []  - Simple Wound Assessment / Reassessment - one wound 0 X - Complex Wound Assessment / Reassessment - multiple wounds 2 5 []  - Dermatologic / Skin Assessment (not related to wound area) 0 ASSESSMENTS - Focused Assessment []  - Circumferential Edema Measurements - multi extremities 0 []  - Nutritional Assessment / Counseling / Intervention 0 []  - Lower Extremity Assessment (monofilament, tuning fork, pulses) 0 []  - Peripheral Arterial Disease Assessment (using hand held doppler) 0 ASSESSMENTS - Ostomy and/or Continence Assessment and Care []  - Incontinence Assessment and Management 0 []  - Ostomy Care Assessment and Management (repouching, etc.) 0 PROCESS - Coordination of Care X - Simple Patient / Family Education for ongoing care 1 15 []  - Complex (extensive) Patient / Family Education for ongoing care 0 []  - Staff obtains Chiropractor, Records, Test Results / Process Orders 0 []  - Staff telephones HHA, Nursing Homes / Clarify orders / etc 0 []  - Routine Transfer to another Facility (non-emergent condition) 0 [name], James ([id number]) []  - Routine Hospital Admission (non-emergent condition) 0 []  - New Admissions / Manufacturing engineer / Ordering NPWT, Apligraf, etc. 0 []  - Emergency Hospital Admission (emergent condition) 0 X - Simple Discharge Coordination 1 10 []  - Complex (extensive) Discharge Coordination 0 PROCESS - Special Needs []  - Pediatric / Minor Patient Management 0 []  - Isolation Patient Management 0 []  - Hearing / Language / Visual special needs 0 []  - Assessment of Community assistance (transportation, D/C planning, etc.) 0 []  - Additional assistance / Altered mentation 0 []  - Support Surface(s) Assessment (bed, cushion, seat, etc.) 0 INTERVENTIONS - Wound Cleansing / Measurement []  - Simple Wound Cleansing - one wound  0 X - Complex Wound Cleansing - multiple wounds 2 5 X - Wound Imaging (photographs - any number of wounds) 1 5 []  - Wound Tracing (instead of photographs) 0 []  -  Simple Wound Measurement - one wound 0 X - Complex Wound Measurement - multiple wounds 2 5 INTERVENTIONS - Wound Dressings X - Small Wound Dressing one or multiple wounds 2 10 []  - Medium Wound Dressing one or multiple wounds 0 []  - Large Wound Dressing one or multiple wounds 0 []  - Application of Medications - topical 0 []  - Application of Medications - injection 0 INTERVENTIONS - Miscellaneous []  - External ear exam 0 [name], James ([id number]) []  - Specimen Collection (cultures, biopsies, blood, body fluids, etc.) 0 []  - Specimen(s) / Culture(s) sent or taken to Lab for analysis 0 []  - Patient Transfer (multiple staff / Michiel Sites Lift / Similar devices) 0 []  - Simple Staple / Suture removal (25 or less) 0 []  - Complex Staple / Suture removal (26 or more) 0 []  - Hypo / Hyperglycemic Management (close monitor of Blood Glucose) 0 []  - Ankle / Brachial Index (ABI) - do not check if billed separately 0 X - Vital Signs 1 5 Has the patient been seen at the hospital within the last three years: Yes Total Score: 100 Level Of Care: New/Established - Level 3 Electronic Signature(s) Signed: 09/21/2014 4:16:34 PM By: Curtis Sites Entered By: Curtis Sites on 09/21/2014 10:53:51 James [name] ([id number]) -------------------------------------------------------------------------------- Encounter Discharge Information Details Patient Name: James [name] Date of Service: 09/21/2014 10:15 AM Medical Record Number: [id number] Patient Account Number: [id number] Date of Birth/Sex: 1933/06/11 (81 y.o. Male) Treating RN: Curtis Sites Primary Care Physician: Maudie Flakes Other Clinician: Referring Physician: Maudie Flakes Treating Physician/Extender: Rudene Re in Treatment: 1 Encounter Discharge Information  Items Discharge Pain Level: 0 Discharge Condition: Stable Ambulatory Status: Wheelchair Discharge Destination: Home Transportation: Private Auto Accompanied By: spouse Schedule Follow-up Appointment: Yes Medication Reconciliation completed and provided to Patient/Care No Rajvir Ernster: Provided on Clinical Summary of Care: 09/21/2014 Form Type Recipient Paper Patient MD Electronic Signature(s) Signed: 09/21/2014 11:15:52 AM By: Gwenlyn Perking Entered By: Gwenlyn Perking on 09/21/2014 11:15:52 James [name] ([id number]) -------------------------------------------------------------------------------- Multi Wound Chart Details Patient Name: James [name] Date of Service: 09/21/2014 10:15 AM Medical Record Number: [id number] Patient Account Number: [id number] Date of Birth/Sex: 04-28-1933 (81 y.o. Male) Treating RN: Curtis Sites Primary Care Physician: Maudie Flakes Other Clinician: Referring Physician: Maudie Flakes Treating Physician/Extender: Rudene Re in Treatment: 1 Vital Signs Height(in): 74 Pulse(bpm): 74 Weight(lbs): 198 Blood Pressure 102/54 (mmHg): Body Mass Index(BMI): 25 Temperature(F): 97.8 Respiratory Rate 18 (breaths/min): Photos: [3:No Photos] [4:No Photos] [N/A:N/A] Wound Location: [3:Right Gluteus] [4:Left Gluteus] [N/A:N/A] Wounding Event: [3:Pressure Injury] [4:Pressure Injury] [N/A:N/A] Primary Etiology: [3:Pressure Ulcer] [4:Pressure Ulcer] [N/A:N/A] Comorbid History: [3:Chronic sinus problems/congestion, Asthma, Chronic Obstructive Pulmonary Disease (COPD), Arrhythmia, Congestive Heart Failure, Coronary Artery Disease, Type II Diabetes, History of pressure wounds] [4:Chronic sinus  problems/congestion, Asthma, Chronic Obstructive Pulmonary Disease (COPD), Arrhythmia, Congestive Heart Failure, Coronary Artery Disease, Type II Diabetes, History of pressure wounds] [N/A:N/A] Date Acquired: [3:08/06/2014] [4:08/06/2014] [N/A:N/A] Weeks of  Treatment: [3:1] [4:1] [N/A:N/A] Wound Status: [3:Open] [4:Open] [N/A:N/A] Measurements L x W x D 1.5x1x0.1 [4:0.3x0.3x0.1] [N/A:N/A] (cm) Area (cm) : [3:1.178] [4:0.071] [N/A:N/A] Volume (cm) : [3:0.118] [4:0.007] [N/A:N/A] % Reduction in Area: [3:84.10%] [4:88.30%] [N/A:N/A] % Reduction in Volume: 84.10% [4:94.50%] [N/A:N/A] Classification: [3:Category/Stage II] [4:Category/Stage II] [N/A:N/A] Exudate Amount: [3:None Present] [4:None Present] [N/A:N/A] Wound Margin: [3:Indistinct, nonvisible] [4:Distinct, outline attached] [N/A:N/A] Granulation Amount: [3:Large (67-100%)] [4:Large (67-100%)] [N/A:N/A] Granulation Quality: [3:Pink] [4:N/A] [N/A:N/A] Necrotic Amount: [3:None Present (0%)] [4:None Present (0%)] [N/A:N/A] Exposed Structures: [N/A:N/A] Fascia: No Fascia: No Fat: No Fat: No Tendon: No Tendon: No Muscle:  No Muscle: No Joint: No Joint: No Bone: No Bone: No Limited to Skin Limited to Skin Breakdown Breakdown Epithelialization: Large (67-100%) Large (67-100%) N/A Periwound Skin Texture: Edema: No Edema: No N/A Excoriation: No Excoriation: No Induration: No Induration: No Callus: No Callus: No Crepitus: No Crepitus: No Fluctuance: No Fluctuance: No Friable: No Friable: No Rash: No Rash: No Scarring: No Scarring: No Periwound Skin Maceration: No Maceration: No N/A Moisture: Moist: No Moist: No Dry/Scaly: No Dry/Scaly: No Periwound Skin Color: Atrophie Blanche: No Atrophie Blanche: No N/A Cyanosis: No Cyanosis: No Ecchymosis: No Ecchymosis: No Erythema: No Erythema: No Hemosiderin Staining: No Hemosiderin Staining: No Mottled: No Mottled: No Pallor: No Pallor: No Rubor: No Rubor: No Temperature: No Abnormality No Abnormality N/A Tenderness on Yes Yes N/A Palpation: Wound Preparation: Ulcer Cleansing: Ulcer Cleansing: N/A Rinsed/Irrigated with Rinsed/Irrigated with Saline Saline Topical Anesthetic Topical Anesthetic Applied:  Other: lidocaine Applied: Other: Lidocaine 4% 4% Treatment Notes Electronic Signature(s) Signed: 09/21/2014 4:16:34 PM By: Curtis Sites Entered By: Curtis Sites on 09/21/2014 10:46:04 James [name] ([id number]) -------------------------------------------------------------------------------- Multi-Disciplinary Care Plan Details Patient Name: James [name] Date of Service: 09/21/2014 10:15 AM Medical Record Number: [id number] Patient Account Number: [id number] Date of Birth/Sex: December 01, 1933 (81 y.o. Male) Treating RN: Curtis Sites Primary Care Physician: Maudie Flakes Other Clinician: Referring Physician: Maudie Flakes Treating Physician/Extender: Rudene Re in Treatment: 1 Active Inactive Abuse / Safety / Falls / Self Care Management Nursing Diagnoses: Potential for falls Goals: Patient will remain injury free Date Initiated: 09/13/2014 Goal Status: Active Interventions: Assess fall risk on admission and as needed Notes: Orientation to the Wound Care Program Nursing Diagnoses: Knowledge deficit related to the wound healing center program Goals: Patient/caregiver will verbalize understanding of the Wound Healing Center Program Date Initiated: 09/13/2014 Goal Status: Active Interventions: Provide education on orientation to the wound center Notes: Pressure Nursing Diagnoses: Potential for impaired tissue integrity related to pressure, friction, moisture, and shear Goals: Patient will remain free of pressure ulcers Date Initiated: 09/13/2014 James [name] ([id number]) Goal Status: Active Interventions: Provide education on pressure ulcers Notes: Wound/Skin Impairment Nursing Diagnoses: Impaired tissue integrity Goals: Ulcer/skin breakdown will heal within 14 weeks Date Initiated: 09/13/2014 Goal Status: Active Interventions: Assess ulceration(s) every visit Notes: Electronic Signature(s) Signed: 09/21/2014 4:16:34 PM By: Curtis Sites Entered By:  Curtis Sites on 09/21/2014 10:45:56 James [name] ([id number]) -------------------------------------------------------------------------------- Patient/Caregiver Education Details Patient Name: James [name] Date of Service: 09/21/2014 10:15 AM Medical Record Number: [id number] Patient Account Number: [id number] Date of Birth/Gender: 1933/09/14 (81 y.o. Male) Treating RN: Curtis Sites Primary Care Physician: Maudie Flakes Other Clinician: Referring Physician: Maudie Flakes Treating Physician/Extender: Rudene Re in Treatment: 1 Education Assessment Education Provided To: Patient and Caregiver Education Topics Provided Pressure: Handouts: Other: w/c cushions and pressure ulcer prevention Methods: Demonstration, Explain/Verbal Responses: State content correctly Wound/Skin Impairment: Handouts: Other: wound care as ordered Methods: Demonstration, Explain/Verbal Responses: State content correctly Electronic Signature(s) Signed: 09/21/2014 4:16:34 PM By: Curtis Sites Entered By: Curtis Sites on 09/21/2014 10:52:02 James [name] ([id number]) -------------------------------------------------------------------------------- Wound Assessment Details Patient Name: James [name] Date of Service: 09/21/2014 10:15 AM Medical Record Number: [id number] Patient Account Number: [id number] Date of Birth/Sex: [birth year] (81 y.o. Male) Treating RN: Curtis Sites Primary Care Physician: Maudie Flakes Other Clinician: Referring Physician: Maudie Flakes Treating Physician/Extender: Rudene Re in Treatment: 1 Wound Status Wound Number: 3 Primary Pressure Ulcer Etiology: Wound Location: Right Gluteus Wound Open Wounding Event: Pressure Injury Status: Date Acquired: 08/06/2014 Comorbid Chronic sinus problems/congestion, Weeks Of Treatment: 1 History: Asthma, Chronic  Obstructive Pulmonary Clustered Wound: No Disease (COPD), Arrhythmia, Congestive Heart  Failure, Coronary Artery Disease, Type II Diabetes, History of pressure wounds Photos Photo Uploaded By: Curtis Sites on 09/21/2014 15:54:21 Wound Measurements Length: (cm) 1.5 Width: (cm) 1 Depth: (cm) 0.1 Area: (cm) 1.178 Volume: (cm) 0.118 % Reduction in Area: 84.1% % Reduction in Volume: 84.1% Epithelialization: Large (67-100%) Tunneling: No Undermining: No Wound Description Classification: Category/Stage II Wound Margin: Indistinct, nonvisible Exudate Amount: None Present Wound Bed Granulation Amount: Large (67-100%) Exposed Structure Granulation Quality: Pink Fascia Exposed: No Necrotic Amount: None Present (0%) Fat Layer Exposed: No Constante, Trystan ([id number]) Tendon Exposed: No Muscle Exposed: No Joint Exposed: No Bone Exposed: No Limited to Skin Breakdown Periwound Skin Texture Texture Color No Abnormalities Noted: No No Abnormalities Noted: No Callus: No Atrophie Blanche: No Crepitus: No Cyanosis: No Excoriation: No Ecchymosis: No Fluctuance: No Erythema: No Friable: No Hemosiderin Staining: No Induration: No Mottled: No Localized Edema: No Pallor: No Rash: No Rubor: No Scarring: No Temperature / Pain Moisture Temperature: No Abnormality No Abnormalities Noted: No Tenderness on Palpation: Yes Dry / Scaly: No Maceration: No Moist: No Wound Preparation Ulcer Cleansing: Rinsed/Irrigated with Saline Topical Anesthetic Applied: Other: lidocaine 4%, Treatment Notes Wound #3 (Right Gluteus) 1. Cleansed with: Clean wound with Normal Saline 2. Anesthetic Topical Lidocaine 4% cream to wound bed prior to debridement 3. Peri-wound Care: Skin Prep 4. Dressing Applied: Prisma Ag 5. Secondary Dressing Applied Bordered Foam Dressing Electronic Signature(s) Signed: 09/21/2014 4:16:34 PM By: Curtis Sites Entered By: Curtis Sites on 09/21/2014 10:45:26 James [name]  ([id number]) -------------------------------------------------------------------------------- Wound Assessment Details Patient Name: James [name] Date of Service: 09/21/2014 10:15 AM Medical Record Number: [id number] Patient Account Number: [id number] Date of Birth/Sex: Jan 01, 1934 (81 y.o. Male) Treating RN: Curtis Sites Primary Care Physician: Maudie Flakes Other Clinician: Referring Physician: Maudie Flakes Treating Physician/Extender: Rudene Re in Treatment: 1 Wound Status Wound Number: 4 Primary Pressure Ulcer Etiology: Wound Location: Left Gluteus Wound Open Wounding Event: Pressure Injury Status: Date Acquired: 08/06/2014 Comorbid Chronic sinus problems/congestion, Weeks Of Treatment: 1 History: Asthma, Chronic Obstructive Pulmonary Clustered Wound: No Disease (COPD), Arrhythmia, Congestive Heart Failure, Coronary Artery Disease, Type II Diabetes, History of pressure wounds Photos Photo Uploaded By: Curtis Sites on 09/21/2014 15:54:22 Wound Measurements Length: (cm) 0.3 Width: (cm) 0.3 Depth: (cm) 0.1 Area: (cm) 0.071 Volume: (cm) 0.007 % Reduction in Area: 88.3% % Reduction in Volume: 94.5% Epithelialization: Large (67-100%) Tunneling: No Undermining: No Wound Description Classification: Category/Stage II Wound Margin: Distinct, outline attached Exudate Amount: None Present Wound Bed Granulation Amount: Large (67-100%) Exposed Structure Necrotic Amount: None Present (0%) Fascia Exposed: No Fat Layer Exposed: No Onofrio, Mervin ([id number]) Tendon Exposed: No Muscle Exposed: No Joint Exposed: No Bone Exposed: No Limited to Skin Breakdown Periwound Skin Texture Texture Color No Abnormalities Noted: No No Abnormalities Noted: No Callus: No Atrophie Blanche: No Crepitus: No Cyanosis: No Excoriation: No Ecchymosis: No Fluctuance: No Erythema: No Friable: No Hemosiderin Staining: No Induration: No Mottled: No Localized  Edema: No Pallor: No Rash: No Rubor: No Scarring: No Temperature / Pain Moisture Temperature: No Abnormality No Abnormalities Noted: No Tenderness on Palpation: Yes Dry / Scaly: No Maceration: No Moist: No Wound Preparation Ulcer Cleansing: Rinsed/Irrigated with Saline Topical Anesthetic Applied: Other: Lidocaine 4%, Treatment Notes Wound #4 (Left Gluteus) 1. Cleansed with: Clean wound with Normal Saline 2. Anesthetic Topical Lidocaine 4% cream to wound bed prior to debridement 3. Peri-wound Care: Skin Prep 4. Dressing Applied: Prisma Ag 5. Secondary Dressing Applied Bordered  Foam Dressing Electronic Signature(s) Signed: 09/21/2014 4:16:34 PM By: Curtis Sites Entered By: Curtis Sites on 09/21/2014 10:45:48 James [name] ([id number]) -------------------------------------------------------------------------------- Vitals Details Patient Name: James [name] Date of Service: 09/21/2014 10:15 AM Medical Record Number: [id number] Patient Account Number: [id number] Date of Birth/Sex: [birth year] (81 y.o. Male) Treating RN: Curtis Sites Primary Care Physician: Maudie Flakes Other Clinician: Referring Physician: Maudie Flakes Treating Physician/Extender: Rudene Re in Treatment: 1 Vital Signs Time Taken: 10:20 Temperature (F): 97.8 Height (in): 74 Pulse (bpm): 74 Weight (lbs): 198 Respiratory Rate (breaths/min): 18 Body Mass Index (BMI): 25.4 Blood Pressure (mmHg): 102/54 Reference Range: 80 - 120 mg / dl Electronic Signature(s) Signed: 09/21/2014 4:16:34 PM By: Curtis Sites Entered By: Curtis Sites on 09/21/2014 10:21:19

## 2014-09-28 ENCOUNTER — Encounter: Payer: Medicare Other | Admitting: Surgery

## 2014-09-28 DIAGNOSIS — L89322 Pressure ulcer of left buttock, stage 2: Secondary | ICD-10-CM | POA: Diagnosis not present

## 2014-09-29 NOTE — Progress Notes (Signed)
BOSS, DANIELSEN ([id number]) Visit Report for 09/28/2014 Arrival Information Details Patient Name: James [name], James [name] Date of Service: 09/28/2014 1:00 PM Medical Record Number: [id number] Patient Account Number: [id number] Date of Birth/Sex: 30-Jun-1933 (81 y.o. Male) Treating RN: Curtis Sites Primary Care Physician: Maudie Flakes Other Clinician: Referring Physician: Maudie Flakes Treating Physician/Extender: Rudene Re in Treatment: 2 Visit Information History Since Last Visit Added or deleted any medications: No Patient Arrived: Wheel Chair Any new allergies or adverse reactions: No Arrival Time: 13:16 Had a fall or experienced change in No Accompanied By: spouse activities of daily living that may affect Transfer Assistance: Manual risk of falls: Patient Identification Verified: Yes Signs or symptoms of abuse/neglect since last No Secondary Verification Process Yes visito Completed: Hospitalized since last visit: No Patient Has Alerts: Yes Pain Present Now: No Patient Alerts: Patient on Blood Thinner Type II Diabetic Electronic Signature(s) Signed: 09/28/2014 5:20:03 PM By: Curtis Sites Entered By: Curtis Sites on 09/28/2014 13:16:37 James [name] ([id number]) -------------------------------------------------------------------------------- Clinic Level of Care Assessment Details Patient Name: James [name] Date of Service: 09/28/2014 1:00 PM Medical Record Number: [id number] Patient Account Number: [id number] Date of Birth/Sex: [birth year] (80 y.o. Male) Treating RN: Curtis Sites Primary Care Physician: Maudie Flakes Other Clinician: Referring Physician: Maudie Flakes Treating Physician/Extender: Rudene Re in Treatment: 2 Clinic Level of Care Assessment Items TOOL 4 Quantity Score []  - Use when only an EandM is performed on FOLLOW-UP visit 0 ASSESSMENTS - Nursing Assessment / Reassessment X - Reassessment of Co-morbidities  (includes updates in patient status) 1 10 X - Reassessment of Adherence to Treatment Plan 1 5 ASSESSMENTS - Wound and Skin Assessment / Reassessment []  - Simple Wound Assessment / Reassessment - one wound 0 []  - Complex Wound Assessment / Reassessment - multiple wounds 0 []  - Dermatologic / Skin Assessment (not related to wound area) 0 ASSESSMENTS - Focused Assessment []  - Circumferential Edema Measurements - multi extremities 0 []  - Nutritional Assessment / Counseling / Intervention 0 []  - Lower Extremity Assessment (monofilament, tuning fork, pulses) 0 []  - Peripheral Arterial Disease Assessment (using hand held doppler) 0 ASSESSMENTS - Ostomy and/or Continence Assessment and Care []  - Incontinence Assessment and Management 0 []  - Ostomy Care Assessment and Management (repouching, etc.) 0 PROCESS - Coordination of Care X - Simple Patient / Family Education for ongoing care 1 15 []  - Complex (extensive) Patient / Family Education for ongoing care 0 []  - Staff obtains Chiropractor, Records, Test Results / Process Orders 0 []  - Staff telephones HHA, Nursing Homes / Clarify orders / etc 0 []  - Routine Transfer to another Facility (non-emergent condition) 0 Kooy, Tatem ([id number]) []  - Routine Hospital Admission (non-emergent condition) 0 []  - New Admissions / Manufacturing engineer / Ordering NPWT, Apligraf, etc. 0 []  - Emergency Hospital Admission (emergent condition) 0 X - Simple Discharge Coordination 1 10 []  - Complex (extensive) Discharge Coordination 0 PROCESS - Special Needs []  - Pediatric / Minor Patient Management 0 []  - Isolation Patient Management 0 []  - Hearing / Language / Visual special needs 0 []  - Assessment of Community assistance (transportation, D/C planning, etc.) 0 []  - Additional assistance / Altered mentation 0 []  - Support Surface(s) Assessment (bed, cushion, seat, etc.) 0 INTERVENTIONS - Wound Cleansing / Measurement []  - Simple Wound Cleansing - one wound  0 []  - Complex Wound Cleansing - multiple wounds 0 []  - Wound Imaging (photographs - any number of wounds) 0 []  - Wound Tracing (instead of photographs) 0 []  - Simple Wound Measurement -  one wound 0 []  - Complex Wound Measurement - multiple wounds 0 INTERVENTIONS - Wound Dressings []  - Small Wound Dressing one or multiple wounds 0 []  - Medium Wound Dressing one or multiple wounds 0 []  - Large Wound Dressing one or multiple wounds 0 []  - Application of Medications - topical 0 []  - Application of Medications - injection 0 INTERVENTIONS - Miscellaneous []  - External ear exam 0 Tramel, Renald ([id number]) []  - Specimen Collection (cultures, biopsies, blood, body fluids, etc.) 0 []  - Specimen(s) / Culture(s) sent or taken to Lab for analysis 0 []  - Patient Transfer (multiple staff / Michiel Sites Lift / Similar devices) 0 []  - Simple Staple / Suture removal (25 or less) 0 []  - Complex Staple / Suture removal (26 or more) 0 []  - Hypo / Hyperglycemic Management (close monitor of Blood Glucose) 0 []  - Ankle / Brachial Index (ABI) - do not check if billed separately 0 X - Vital Signs 1 5 Has the patient been seen at the hospital within the last three years: Yes Total Score: 45 Level Of Care: New/Established - Level 2 Electronic Signature(s) Signed: 09/28/2014 5:20:03 PM By: Curtis Sites Entered By: Curtis Sites on 09/28/2014 13:35:24 James [name] ([id number]) -------------------------------------------------------------------------------- Encounter Discharge Information Details Patient Name: James [name] Date of Service: 09/28/2014 1:00 PM Medical Record Number: [id number] Patient Account Number: [id number] Date of Birth/Sex: February 05, 1934 (81 y.o. Male) Treating RN: Curtis Sites Primary Care Physician: Maudie Flakes Other Clinician: Referring Physician: Maudie Flakes Treating Physician/Extender: Rudene Re in Treatment: 2 Encounter Discharge Information Items Discharge  Pain Level: 0 Discharge Condition: Stable Ambulatory Status: Wheelchair Discharge Destination: Home Transportation: Private Auto Accompanied By: spouse Schedule Follow-up Appointment: Yes Medication Reconciliation completed and provided to Patient/Care No Eriverto Byrnes: Provided on Clinical Summary of Care: 09/28/2014 Form Type Recipient Paper Patient MD Electronic Signature(s) Signed: 09/28/2014 1:41:26 PM By: Gwenlyn Perking Entered By: Gwenlyn Perking on 09/28/2014 13:41:25 James [name] ([id number]) -------------------------------------------------------------------------------- Multi-Disciplinary Care Plan Details Patient Name: James [name] Date of Service: 09/28/2014 1:00 PM Medical Record Number: [id number] Patient Account Number: [id number] Date of Birth/Sex: 1933-03-19 (81 y.o. Male) Treating RN: Curtis Sites Primary Care Physician: Maudie Flakes Other Clinician: Referring Physician: Maudie Flakes Treating Physician/Extender: Rudene Re in Treatment: 2 Active Inactive Electronic Signature(s) Signed: 09/28/2014 5:20:03 PM By: Curtis Sites Entered By: Curtis Sites on 09/28/2014 13:34:14 James [name] ([id number]) -------------------------------------------------------------------------------- Patient/Caregiver Education Details Patient Name: James [name] Date of Service: 09/28/2014 1:00 PM Medical Record Number: [id number] Patient Account Number: [id number] Date of Birth/Gender: Feb 14, 1933 (81 y.o. Male) Treating RN: Curtis Sites Primary Care Physician: Maudie Flakes Other Clinician: Referring Physician: Maudie Flakes Treating Physician/Extender: Rudene Re in Treatment: 2 Education Assessment Education Provided To: Patient and Caregiver Education Topics Provided Pressure: Handouts: Other: continue offloading Methods: Explain/Verbal Responses: State content correctly Electronic Signature(s) Signed: 09/28/2014 5:20:03 PM By:  Curtis Sites Entered By: Curtis Sites on 09/28/2014 13:28:29 James [name] ([id number]) -------------------------------------------------------------------------------- Wound Assessment Details Patient Name: James [name] Date of Service: 09/28/2014 1:00 PM Medical Record Number: [id number] Patient Account Number: [id number] Date of Birth/Sex: 02-23-1933 (81 y.o. Male) Treating RN: Curtis Sites Primary Care Physician: Maudie Flakes Other Clinician: Referring Physician: Maudie Flakes Treating Physician/Extender: Rudene Re in Treatment: 2 Wound Status Wound Number: 3 Primary Etiology: Pressure Ulcer Wound Location: Right Gluteus Wound Status: Open Wounding Event: Pressure Injury Date Acquired: 08/06/2014 Weeks Of Treatment: 2 Clustered Wound: No Photos Photo Uploaded By: Elliot Gurney, RN, BSN, Kim on 09/28/2014 17:03:58 Wound Measurements Length: (cm) 0 % Reduction in  Width: (cm) 0 % Reduction in Depth: (cm) 0 Area: (cm) 0 Volume: (cm) 0 Area: 100% Volume: 100% Wound Description Classification: Category/Stage II Periwound Skin Texture Texture Color No Abnormalities Noted: No No Abnormalities Noted: No Moisture No Abnormalities Noted: No Electronic Signature(s) Signed: 09/28/2014 5:20:03 PM By: Lovina Reach, Judie Petit ([id number]) Entered By: Curtis Sites on 09/28/2014 13:27:50 James [name] ([id number]) -------------------------------------------------------------------------------- Wound Assessment Details Patient Name: James [name] Date of Service: 09/28/2014 1:00 PM Medical Record Number: [id number] Patient Account Number: [id number] Date of Birth/Sex: 1933/09/15 (81 y.o. Male) Treating RN: Curtis Sites Primary Care Physician: Maudie Flakes Other Clinician: Referring Physician: Maudie Flakes Treating Physician/Extender: Rudene Re in Treatment: 2 Wound Status Wound Number: 4 Primary Etiology: Pressure Ulcer Wound  Location: Left Gluteus Wound Status: Open Wounding Event: Pressure Injury Date Acquired: 08/06/2014 Weeks Of Treatment: 2 Clustered Wound: No Photos Photo Uploaded By: Elliot Gurney, RN, BSN, Kim on 09/28/2014 17:04:34 Wound Measurements Length: (cm) 0 % Reduction in Width: (cm) 0 % Reduction in Depth: (cm) 0 Area: (cm) 0 Volume: (cm) 0 Area: 100% Volume: 100% Wound Description Classification: Category/Stage II Periwound Skin Texture Texture Color No Abnormalities Noted: No No Abnormalities Noted: No Moisture No Abnormalities Noted: No Electronic Signature(s) Signed: 09/28/2014 5:20:03 PM By: Lovina Reach, Judie Petit ([id number]) Entered By: Curtis Sites on 09/28/2014 13:27:50 James [name] ([id number]) -------------------------------------------------------------------------------- Vitals Details Patient Name: James [name] Date of Service: 09/28/2014 1:00 PM Medical Record Number: [id number] Patient Account Number: [id number] Date of Birth/Sex: 30-May-1933 (81 y.o. Male) Treating RN: Curtis Sites Primary Care Physician: Maudie Flakes Other Clinician: Referring Physician: Maudie Flakes Treating Physician/Extender: Rudene Re in Treatment: 2 Vital Signs Time Taken: 13:17 Temperature (F): 98.1 Height (in): 74 Pulse (bpm): 76 Weight (lbs): 198 Respiratory Rate (breaths/min): 18 Body Mass Index (BMI): 25.4 Blood Pressure (mmHg): 122/47 Reference Range: 80 - 120 mg / dl Electronic Signature(s) Signed: 09/28/2014 5:20:03 PM By: Curtis Sites Entered By: Curtis Sites on 09/28/2014 13:17:17

## 2014-09-29 NOTE — Progress Notes (Signed)
James [name], James [name] ([id number]) Visit Report for 09/28/2014 Chief Complaint Document Details Patient Name: James [name], James [name] Date of Service: 09/28/2014 1:00 PM Medical Record Number: [id number] Patient Account Number: [id number] Date of Birth/Sex: 08/22/1933 (82 y.o. Male) Treating RN: Curtis Sites Primary Care Physician: Maudie Flakes Other Clinician: Referring Physician: Maudie Flakes Treating Physician/Extender: Rudene Re in Treatment: 2 Information Obtained from: Patient Chief Complaint Patient returns to the wound care center for reopened ulcer to: Both gluteal areas. He was treated in May 2016 by me and did very well with conservator therapy. Electronic Signature(s) Signed: 09/28/2014 1:37:51 PM By: Evlyn Kanner MD, FACS Entered By: Evlyn Kanner on 09/28/2014 13:37:51 James [name] ([id number]) -------------------------------------------------------------------------------- HPI Details Patient Name: James [name] Date of Service: 09/28/2014 1:00 PM Medical Record Number: [id number] Patient Account Number: [id number] Date of Birth/Sex: 1933/09/27 (81 y.o. Male) Treating RN: Curtis Sites Primary Care Physician: Maudie Flakes Other Clinician: Referring Physician: Maudie Flakes Treating Physician/Extender: Rudene Re in Treatment: 2 History of Present Illness Location: Patient presents with an ulcer on the right and left buttock Quality: Patient reports experiencing a dull pain to affected area(s). Severity: Patient states wound are getting worse. Duration: Patient has had the wound for > 3 months prior to seeking treatment at the wound center Timing: Pain in wound is Intermittent (comes and goes Context: The wound occurred when the patient had a fall and injured himself on his side but he was on his bottom and dragged himself for a while until he could get to a phone. Modifying Factors: he sits in a chair for about 18 hours a day and sleeps about  6 hours. Associated Signs and Symptoms: Patient reports having difficulty standing for long periods. HPI Description: The patient was seen recently by Dr. Maryjane Hurter on 09/07/2014 with a return of the pressure ulcers which he had on the bilateral gluteal regions. His most recent hemoglobin A1c was 5.8 that day. He was known to our wound care center for a similar problem in May 2016 and was seen by me. 81 year old gentleman whose had chronic morbidities including asthma, diabetes mellitus type 2, chronic disease, COPD, hypertension, atrial fibrillation, SVT, CAD, cardiomyopathy, congestive heart failure. His diabetes is diet controlled and he does not have to take any medications for a long while. His last hemoglobin A1c was 5.8. Was a former smoker and has not smoked since 1985.He is not taken any specific treatment for his present problems. during his last visit here he has received air mattress and wheelchair cushion. However he sent it back for possible monetary reasons and has not been using those. He continues to offload as was discussed with him last time but I don't know how diligent he is being. He does walk around a little bit around the house. Electronic Signature(s) Signed: 09/28/2014 1:37:56 PM By: Evlyn Kanner MD, FACS Entered By: Evlyn Kanner on 09/28/2014 13:37:56 James [name] ([id number]) -------------------------------------------------------------------------------- Physical Exam Details Patient Name: James [name] Date of Service: 09/28/2014 1:00 PM Medical Record Number: [id number] Patient Account Number: [id number] Date of Birth/Sex: January 20, 1934 (81 y.o. Male) Treating RN: Curtis Sites Primary Care Physician: Maudie Flakes Other Clinician: Referring Physician: Maudie Flakes Treating Physician/Extender: Rudene Re in Treatment: 2 Constitutional . Pulse regular. Respirations normal and unlabored. Afebrile. . Eyes Nonicteric. Reactive to  light. Ears, Nose, Mouth, and Throat Lips, teeth, and gums WNL.Marland Kitchen Moist mucosa without lesions . Neck supple and nontender. No palpable supraclavicular or cervical adenopathy. Normal sized without goiter. Respiratory WNL. No retractions.. Cardiovascular Pedal Pulses WNL.  No clubbing, cyanosis or edema. Lymphatic No adneopathy. No adenopathy. No adenopathy. Musculoskeletal Adexa without tenderness or enlargement.. Digits and nails w/o clubbing, cyanosis, infection, petechiae, ischemia, or inflammatory conditions.. Integumentary (Hair, Skin) No suspicious lesions. No crepitus or fluctuance. No peri-wound warmth or erythema. No masses.Marland Kitchen Psychiatric Judgement and insight Intact.. No evidence of depression, anxiety, or agitation.. Notes The wounds on his gluteal area was completely healed and he has no open ulcerations. Electronic Signature(s) Signed: 09/28/2014 1:38:28 PM By: Evlyn Kanner MD, FACS Entered By: Evlyn Kanner on 09/28/2014 13:38:27 James [name] ([id number]) -------------------------------------------------------------------------------- Physician Orders Details Patient Name: James [name] Date of Service: 09/28/2014 1:00 PM Medical Record Number: [id number] Patient Account Number: [id number] Date of Birth/Sex: 1933/04/23 (81 y.o. Male) Treating RN: Curtis Sites Primary Care Physician: Maudie Flakes Other Clinician: Referring Physician: Maudie Flakes Treating Physician/Extender: Rudene Re in Treatment: 2 Verbal / Phone Orders: Yes Clinician: Curtis Sites Read Back and Verified: Yes Diagnosis Coding Discharge From [facility] Services o Discharge from Wound Care Center Electronic Signature(s) Signed: 09/28/2014 4:00:25 PM By: Evlyn Kanner MD, FACS Signed: 09/28/2014 5:20:03 PM By: Curtis Sites Entered By: Curtis Sites on 09/28/2014 13:35:42 James [name]  ([id number]) -------------------------------------------------------------------------------- Problem List Details Patient Name: James [name] Date of Service: 09/28/2014 1:00 PM Medical Record Number: [id number] Patient Account Number: [id number] Date of Birth/Sex: [birth year] (81 y.o. Male) Treating RN: Curtis Sites Primary Care Physician: Maudie Flakes Other Clinician: Referring Physician: Maudie Flakes Treating Physician/Extender: Rudene Re in Treatment: 2 Active Problems ICD-10 Encounter Code Description Active Date Diagnosis E11.622 Type 2 diabetes mellitus with other skin ulcer 09/13/2014 Yes L89.322 Pressure ulcer of left buttock, stage 2 09/13/2014 Yes L89.312 Pressure ulcer of right buttock, stage 2 09/13/2014 Yes Inactive Problems Resolved Problems Electronic Signature(s) Signed: 09/28/2014 1:37:44 PM By: Evlyn Kanner MD, FACS Entered By: Evlyn Kanner on 09/28/2014 13:37:44 James [name] ([id number]) -------------------------------------------------------------------------------- Progress Note Details Patient Name: James [name] Date of Service: 09/28/2014 1:00 PM Medical Record Number: [id number] Patient Account Number: [id number] Date of Birth/Sex: [birth year] (81 y.o. Male) Treating RN: Curtis Sites Primary Care Physician: Maudie Flakes Other Clinician: Referring Physician: Maudie Flakes Treating Physician/Extender: Rudene Re in Treatment: 2 Subjective Chief Complaint Information obtained from Patient Patient returns to the wound care center for reopened ulcer to: Both gluteal areas. He was treated in May 2016 by me and did very well with conservator therapy. History of Present Illness (HPI) The following HPI elements were documented for the patient's wound: Location: Patient presents with an ulcer on the right and left buttock Quality: Patient reports experiencing a dull pain to affected area(s). Severity: Patient states wound  are getting worse. Duration: Patient has had the wound for > 3 months prior to seeking treatment at the wound center Timing: Pain in wound is Intermittent (comes and goes Context: The wound occurred when the patient had a fall and injured himself on his side but he was on his bottom and dragged himself for a while until he could get to a phone. Modifying Factors: he sits in a chair for about 18 hours a day and sleeps about 6 hours. Associated Signs and Symptoms: Patient reports having difficulty standing for long periods. The patient was seen recently by Dr. Maryjane Hurter on 09/07/2014 with a return of the pressure ulcers which he had on the bilateral gluteal regions. His most recent hemoglobin A1c was 5.8 that day. He was known to our wound care center for a similar problem in May 2016 and was seen by me. 81 year old gentleman  whose had chronic morbidities including asthma, diabetes mellitus type 2, chronic disease, COPD, hypertension, atrial fibrillation, SVT, CAD, cardiomyopathy, congestive heart failure. His diabetes is diet controlled and he does not have to take any medications for a long while. His last hemoglobin A1c was 5.8. Was a former smoker and has not smoked since 1985.He is not taken any specific treatment for his present problems. during his last visit here he has received air mattress and wheelchair cushion. However he sent it back for possible monetary reasons and has not been using those. He continues to offload as was discussed with him last time but I don't know how diligent he is being. He does walk around a little bit around the house. Objective James [name], James [name] ([id number]) Constitutional Pulse regular. Respirations normal and unlabored. Afebrile. Vitals Time Taken: 1:17 PM, Height: 74 in, Weight: 198 lbs, BMI: 25.4, Temperature: 98.1 F, Pulse: 76 bpm, Respiratory Rate: 18 breaths/min, Blood Pressure: 122/47 mmHg. Eyes Nonicteric. Reactive to light. Ears, Nose, Mouth,  and Throat Lips, teeth, and gums WNL.Marland Kitchen Moist mucosa without lesions . Neck supple and nontender. No palpable supraclavicular or cervical adenopathy. Normal sized without goiter. Respiratory WNL. No retractions.. Cardiovascular Pedal Pulses WNL. No clubbing, cyanosis or edema. Lymphatic No adneopathy. No adenopathy. No adenopathy. Musculoskeletal Adexa without tenderness or enlargement.. Digits and nails w/o clubbing, cyanosis, infection, petechiae, ischemia, or inflammatory conditions.Marland Kitchen Psychiatric Judgement and insight Intact.. No evidence of depression, anxiety, or agitation.. General Notes: The wounds on his gluteal area was completely healed and he has no open ulcerations. Integumentary (Hair, Skin) No suspicious lesions. No crepitus or fluctuance. No peri-wound warmth or erythema. No masses.. Wound #3 status is Open. Original cause of wound was Pressure Injury. The wound is located on the Right Gluteus. The wound measures 0cm length x 0cm width x 0cm depth; 0cm^2 area and 0cm^3 volume. Wound #4 status is Open. Original cause of wound was Pressure Injury. The wound is located on the Left Gluteus. The wound measures 0cm length x 0cm width x 0cm depth; 0cm^2 area and 0cm^3 volume. Assessment James [name], James [name] ([id number]) Active Problems ICD-10 E11.622 - Type 2 diabetes mellitus with other skin ulcer L89.322 - Pressure ulcer of left buttock, stage 2 L89.312 - Pressure ulcer of right buttock, stage 2 They have kept a good record of all the ambulation and position change has been doing and I have commended both the patient and his wife on doing an excellent job. The wounds are completely healed and I have discharged him from the wound care services and he will come back to see [place] as needed. Plan Discharge From [facility] Services: Discharge from Wound Care Center They have kept a good record of all the ambulation and position change has been doing and I have commended both the patient and  his wife on doing an excellent job. The wounds are completely healed and I have discharged him from the wound care services and he will come back to see [place] as needed. Electronic Signature(s) Signed: 09/28/2014 1:39:18 PM By: Evlyn Kanner MD, FACS Entered By: Evlyn Kanner on 09/28/2014 13:39:18 James [name] ([id number]) -------------------------------------------------------------------------------- SuperBill Details Patient Name: James [name] Date of Service: 09/28/2014 Medical Record Number: [id number] Patient Account Number: [id number] Date of Birth/Sex: 1933-09-10 (81 y.o. Male) Treating RN: Curtis Sites Primary Care Physician: Maudie Flakes Other Clinician: Referring Physician: Maudie Flakes Treating Physician/Extender: Rudene Re in Treatment: 2 Diagnosis Coding ICD-10 Codes Code Description [phone] Type 2 diabetes mellitus with other skin ulcer L89.322 Pressure ulcer  of left buttock, stage 2 L89.312 Pressure ulcer of right buttock, stage 2 Facility Procedures CPT4 Code: 16109604 Description: [phone] - WOUND CARE VISIT-LEV 2 EST PT Modifier: Quantity: 1 Physician Procedures CPT4 Code: [id number] Description: 99213 - WC PHYS LEVEL 3 - EST PT ICD-10 Description Diagnosis E11.622 Type 2 diabetes mellitus with other skin ulcer L89.322 Pressure ulcer of left buttock, stage 2 L89.312 Pressure ulcer of right buttock, stage 2 Modifier: Quantity: 1 Electronic Signature(s) Signed: 09/28/2014 1:39:30 PM By: Evlyn Kanner MD, FACS Entered By: Evlyn Kanner on 09/28/2014 13:39:29

## 2015-02-26 ENCOUNTER — Inpatient Hospital Stay
Admission: EM | Admit: 2015-02-26 | Discharge: 2015-03-09 | DRG: 190 | Disposition: E | Payer: Medicare Other | Attending: Internal Medicine | Admitting: Internal Medicine

## 2015-02-26 ENCOUNTER — Emergency Department: Payer: Medicare Other

## 2015-02-26 ENCOUNTER — Encounter: Payer: Self-pay | Admitting: Emergency Medicine

## 2015-02-26 DIAGNOSIS — I2510 Atherosclerotic heart disease of native coronary artery without angina pectoris: Secondary | ICD-10-CM | POA: Diagnosis present

## 2015-02-26 DIAGNOSIS — Z9981 Dependence on supplemental oxygen: Secondary | ICD-10-CM | POA: Diagnosis not present

## 2015-02-26 DIAGNOSIS — J9621 Acute and chronic respiratory failure with hypoxia: Secondary | ICD-10-CM | POA: Diagnosis present

## 2015-02-26 DIAGNOSIS — R059 Cough, unspecified: Secondary | ICD-10-CM

## 2015-02-26 DIAGNOSIS — E782 Mixed hyperlipidemia: Secondary | ICD-10-CM | POA: Diagnosis present

## 2015-02-26 DIAGNOSIS — Z7901 Long term (current) use of anticoagulants: Secondary | ICD-10-CM

## 2015-02-26 DIAGNOSIS — J9620 Acute and chronic respiratory failure, unspecified whether with hypoxia or hypercapnia: Secondary | ICD-10-CM

## 2015-02-26 DIAGNOSIS — Z951 Presence of aortocoronary bypass graft: Secondary | ICD-10-CM | POA: Diagnosis not present

## 2015-02-26 DIAGNOSIS — R443 Hallucinations, unspecified: Secondary | ICD-10-CM | POA: Diagnosis present

## 2015-02-26 DIAGNOSIS — T45515A Adverse effect of anticoagulants, initial encounter: Secondary | ICD-10-CM | POA: Diagnosis present

## 2015-02-26 DIAGNOSIS — I480 Paroxysmal atrial fibrillation: Secondary | ICD-10-CM | POA: Diagnosis present

## 2015-02-26 DIAGNOSIS — J690 Pneumonitis due to inhalation of food and vomit: Secondary | ICD-10-CM | POA: Diagnosis present

## 2015-02-26 DIAGNOSIS — Z833 Family history of diabetes mellitus: Secondary | ICD-10-CM | POA: Diagnosis not present

## 2015-02-26 DIAGNOSIS — Z952 Presence of prosthetic heart valve: Secondary | ICD-10-CM | POA: Diagnosis not present

## 2015-02-26 DIAGNOSIS — J9690 Respiratory failure, unspecified, unspecified whether with hypoxia or hypercapnia: Secondary | ICD-10-CM

## 2015-02-26 DIAGNOSIS — J441 Chronic obstructive pulmonary disease with (acute) exacerbation: Principal | ICD-10-CM

## 2015-02-26 DIAGNOSIS — I5032 Chronic diastolic (congestive) heart failure: Secondary | ICD-10-CM

## 2015-02-26 DIAGNOSIS — G9340 Encephalopathy, unspecified: Secondary | ICD-10-CM | POA: Diagnosis present

## 2015-02-26 DIAGNOSIS — E871 Hypo-osmolality and hyponatremia: Secondary | ICD-10-CM | POA: Diagnosis present

## 2015-02-26 DIAGNOSIS — I429 Cardiomyopathy, unspecified: Secondary | ICD-10-CM | POA: Diagnosis present

## 2015-02-26 DIAGNOSIS — Z8249 Family history of ischemic heart disease and other diseases of the circulatory system: Secondary | ICD-10-CM | POA: Diagnosis not present

## 2015-02-26 DIAGNOSIS — L8990 Pressure ulcer of unspecified site, unspecified stage: Secondary | ICD-10-CM | POA: Insufficient documentation

## 2015-02-26 DIAGNOSIS — Z515 Encounter for palliative care: Secondary | ICD-10-CM | POA: Diagnosis present

## 2015-02-26 DIAGNOSIS — E119 Type 2 diabetes mellitus without complications: Secondary | ICD-10-CM | POA: Diagnosis present

## 2015-02-26 DIAGNOSIS — E872 Acidosis: Secondary | ICD-10-CM | POA: Diagnosis present

## 2015-02-26 DIAGNOSIS — J9622 Acute and chronic respiratory failure with hypercapnia: Secondary | ICD-10-CM | POA: Diagnosis present

## 2015-02-26 DIAGNOSIS — I110 Hypertensive heart disease with heart failure: Secondary | ICD-10-CM | POA: Diagnosis present

## 2015-02-26 DIAGNOSIS — J440 Chronic obstructive pulmonary disease with acute lower respiratory infection: Secondary | ICD-10-CM | POA: Diagnosis present

## 2015-02-26 DIAGNOSIS — Z66 Do not resuscitate: Secondary | ICD-10-CM | POA: Diagnosis present

## 2015-02-26 DIAGNOSIS — R05 Cough: Secondary | ICD-10-CM

## 2015-02-26 DIAGNOSIS — J189 Pneumonia, unspecified organism: Secondary | ICD-10-CM

## 2015-02-26 DIAGNOSIS — I10 Essential (primary) hypertension: Secondary | ICD-10-CM | POA: Diagnosis not present

## 2015-02-26 DIAGNOSIS — I4891 Unspecified atrial fibrillation: Secondary | ICD-10-CM

## 2015-02-26 DIAGNOSIS — I509 Heart failure, unspecified: Secondary | ICD-10-CM

## 2015-02-26 DIAGNOSIS — R0902 Hypoxemia: Secondary | ICD-10-CM

## 2015-02-26 HISTORY — DX: Essential (primary) hypertension: I10

## 2015-02-26 HISTORY — DX: Heart failure, unspecified: I50.9

## 2015-02-26 HISTORY — DX: Type 2 diabetes mellitus without complications: E11.9

## 2015-02-26 LAB — URINALYSIS COMPLETE WITH MICROSCOPIC (ARMC ONLY)
BACTERIA UA: NONE SEEN
Bilirubin Urine: NEGATIVE
Glucose, UA: 50 mg/dL — AB
Ketones, ur: NEGATIVE mg/dL
LEUKOCYTES UA: NEGATIVE
Nitrite: NEGATIVE
SPECIFIC GRAVITY, URINE: 1.029 (ref 1.005–1.030)
pH: 5 (ref 5.0–8.0)

## 2015-02-26 LAB — BASIC METABOLIC PANEL
ANION GAP: 10 (ref 5–15)
BUN: 23 mg/dL — AB (ref 6–20)
CO2: 29 mmol/L (ref 22–32)
Calcium: 9.1 mg/dL (ref 8.9–10.3)
Chloride: 90 mmol/L — ABNORMAL LOW (ref 101–111)
Creatinine, Ser: 0.57 mg/dL — ABNORMAL LOW (ref 0.61–1.24)
Glucose, Bld: 165 mg/dL — ABNORMAL HIGH (ref 65–99)
POTASSIUM: 3.8 mmol/L (ref 3.5–5.1)
SODIUM: 129 mmol/L — AB (ref 135–145)

## 2015-02-26 LAB — CBC
HEMATOCRIT: 37.3 % — AB (ref 40.0–52.0)
HEMOGLOBIN: 12.5 g/dL — AB (ref 13.0–18.0)
MCH: 31.9 pg (ref 26.0–34.0)
MCHC: 33.6 g/dL (ref 32.0–36.0)
MCV: 95 fL (ref 80.0–100.0)
Platelets: 168 10*3/uL (ref 150–440)
RBC: 3.92 MIL/uL — AB (ref 4.40–5.90)
RDW: 15.2 % — AB (ref 11.5–14.5)
WBC: 10.6 10*3/uL (ref 3.8–10.6)

## 2015-02-26 LAB — GLUCOSE, CAPILLARY
GLUCOSE-CAPILLARY: 133 mg/dL — AB (ref 65–99)
Glucose-Capillary: 156 mg/dL — ABNORMAL HIGH (ref 65–99)

## 2015-02-26 LAB — PROTIME-INR
INR: 1.71
PROTHROMBIN TIME: 20.1 s — AB (ref 11.4–15.0)

## 2015-02-26 LAB — BRAIN NATRIURETIC PEPTIDE: B NATRIURETIC PEPTIDE 5: 375 pg/mL — AB (ref 0.0–100.0)

## 2015-02-26 LAB — TROPONIN I: TROPONIN I: 0.03 ng/mL (ref <0.031)

## 2015-02-26 MED ORDER — DOCUSATE SODIUM 100 MG PO CAPS
100.0000 mg | ORAL_CAPSULE | Freq: Two times a day (BID) | ORAL | Status: DC
Start: 2015-02-26 — End: 2015-02-28
  Administered 2015-02-27: 100 mg via ORAL
  Filled 2015-02-26: qty 1

## 2015-02-26 MED ORDER — ACETAMINOPHEN 650 MG RE SUPP: 650.0000 mg | Freq: Four times a day (QID) | RECTAL | Status: DC | PRN

## 2015-02-26 MED ORDER — AMIODARONE HCL 200 MG PO TABS
400.0000 mg | ORAL_TABLET | ORAL | Status: DC
  Administered 2015-02-27: 400 mg via ORAL
  Filled 2015-02-26: qty 2

## 2015-02-26 MED ORDER — IPRATROPIUM-ALBUTEROL 0.5-2.5 (3) MG/3ML IN SOLN
3.0000 mL | Freq: Four times a day (QID) | RESPIRATORY_TRACT | Status: DC
  Administered 2015-02-26 – 2015-02-27 (×3): 3 mL via RESPIRATORY_TRACT
  Filled 2015-02-26 (×4): qty 3

## 2015-02-26 MED ORDER — BISACODYL 10 MG RE SUPP: 10.0000 mg | Freq: Every day | RECTAL | Status: DC | PRN

## 2015-02-26 MED ORDER — SODIUM CHLORIDE 0.9 % IJ SOLN
3.0000 mL | Freq: Two times a day (BID) | INTRAMUSCULAR | Status: DC
  Administered 2015-02-27: 3 mL via INTRAVENOUS

## 2015-02-26 MED ORDER — HEPARIN SODIUM (PORCINE) 5000 UNIT/ML IJ SOLN
5000.0000 [IU] | Freq: Three times a day (TID) | INTRAMUSCULAR | Status: DC
  Administered 2015-02-26 – 2015-02-27 (×3): 5000 [IU] via SUBCUTANEOUS
  Filled 2015-02-26 (×4): qty 1

## 2015-02-26 MED ORDER — LEVOFLOXACIN IN D5W 500 MG/100ML IV SOLN
500.0000 mg | INTRAVENOUS | Status: DC
  Administered 2015-02-26 – 2015-02-27 (×2): 500 mg via INTRAVENOUS
  Filled 2015-02-26 (×4): qty 100

## 2015-02-26 MED ORDER — WARFARIN SODIUM 1 MG PO TABS: 2.0000 mg | ORAL_TABLET | Freq: Every day | ORAL | Status: DC

## 2015-02-26 MED ORDER — FUROSEMIDE 40 MG PO TABS
40.0000 mg | ORAL_TABLET | Freq: Every day | ORAL | Status: DC
  Administered 2015-02-26 – 2015-02-27 (×2): 40 mg via ORAL
  Filled 2015-02-26 (×2): qty 1

## 2015-02-26 MED ORDER — MORPHINE SULFATE (PF) 2 MG/ML IV SOLN
2.0000 mg | INTRAVENOUS | Status: DC | PRN
  Administered 2015-02-27 – 2015-02-28 (×3): 2 mg via INTRAVENOUS
  Filled 2015-02-26 (×3): qty 1

## 2015-02-26 MED ORDER — LEVOFLOXACIN IN D5W 750 MG/150ML IV SOLN: 750.0000 mg | Freq: Once | INTRAVENOUS | Status: DC

## 2015-02-26 MED ORDER — IPRATROPIUM-ALBUTEROL 0.5-2.5 (3) MG/3ML IN SOLN
3.0000 mL | Freq: Once | RESPIRATORY_TRACT | Status: AC
  Administered 2015-02-26: 3 mL via RESPIRATORY_TRACT
  Filled 2015-02-26: qty 3

## 2015-02-26 MED ORDER — ONDANSETRON HCL 4 MG PO TABS: 4.0000 mg | ORAL_TABLET | Freq: Four times a day (QID) | ORAL | Status: DC | PRN

## 2015-02-26 MED ORDER — DILTIAZEM HCL ER COATED BEADS 180 MG PO CP24
300.0000 mg | ORAL_CAPSULE | ORAL | Status: DC
  Administered 2015-02-27: 300 mg via ORAL
  Filled 2015-02-26: qty 1

## 2015-02-26 MED ORDER — POTASSIUM CHLORIDE CRYS ER 20 MEQ PO TBCR
20.0000 meq | EXTENDED_RELEASE_TABLET | Freq: Every day | ORAL | Status: DC
  Administered 2015-02-26 – 2015-02-27 (×2): 20 meq via ORAL
  Filled 2015-02-26 (×2): qty 1

## 2015-02-26 MED ORDER — ACETAMINOPHEN 325 MG PO TABS
650.0000 mg | ORAL_TABLET | Freq: Four times a day (QID) | ORAL | Status: DC | PRN
  Administered 2015-02-27: 650 mg via ORAL
  Filled 2015-02-26: qty 2

## 2015-02-26 MED ORDER — INSULIN ASPART 100 UNIT/ML ~~LOC~~ SOLN
0.0000 [IU] | Freq: Three times a day (TID) | SUBCUTANEOUS | Status: DC
  Administered 2015-02-27: 2 [IU] via SUBCUTANEOUS
  Filled 2015-02-26: qty 2

## 2015-02-26 MED ORDER — ASPIRIN EC 81 MG PO TBEC
81.0000 mg | DELAYED_RELEASE_TABLET | ORAL | Status: DC
  Administered 2015-02-27: 81 mg via ORAL
  Filled 2015-02-26: qty 1

## 2015-02-26 MED ORDER — HYDRALAZINE HCL 20 MG/ML IJ SOLN
10.0000 mg | INTRAMUSCULAR | Status: DC | PRN
  Administered 2015-02-26: 10 mg via INTRAVENOUS
  Filled 2015-02-26: qty 1

## 2015-02-26 MED ORDER — ONDANSETRON HCL 4 MG/2ML IJ SOLN
4.0000 mg | Freq: Four times a day (QID) | INTRAMUSCULAR | Status: DC | PRN
  Administered 2015-02-27: 4 mg via INTRAVENOUS
  Filled 2015-02-26: qty 2

## 2015-02-26 MED ORDER — SODIUM CHLORIDE 0.9 % IV SOLN
INTRAVENOUS | Status: DC
Start: 2015-02-26 — End: 2015-02-27
  Administered 2015-02-26 – 2015-02-27 (×2): via INTRAVENOUS

## 2015-02-26 NOTE — ED Notes (Signed)
Notified admitting doctor about elevated diastolic BP; "will put hydralazine in "

## 2015-02-26 NOTE — ED Notes (Signed)
Pt presents to ER via EMS from home with c/o weakness and hallucinations x3 days. Alert and oriented x3

## 2015-02-26 NOTE — ED Provider Notes (Signed)
[facility] Emergency Department Provider Note  ____________________________________________  Time seen: Approximately 12:45 PM  I have reviewed the triage vital signs and the nursing notes.   HISTORY  Chief Complaint Weakness and Hallucinations    HPI James [name] is a 82 y.o. male presents for evaluation of shortness of breath and cough, also some confusion. The patient's wife reports that he's had increasing cough, feeling chilled, and started taking "an antibiotic" which his primary care doctor prescribed to him for cough symptoms when they occur. He also reports leg swelling, mild shortness of breath, and that earlier today he was hallucinating bugs and playing games but is better now.  He does report feeling better on oxygen. He suspects he may have "pneumonia".  Denies chest pain.   Past Medical History  Diagnosis Date  . Hypertension   . CHF (congestive heart failure) (HCC)   . Diabetes mellitus without complication (HCC)     diet controlled    There are no active problems to display for this patient.   Past Surgical History  Procedure Laterality Date  . Cardiac surgery      No current outpatient prescriptions on file.  Allergies Sulfa antibiotics  History reviewed. No pertinent family history.  Social History Social History  Substance Use Topics  . Smoking status: Never Smoker   . Smokeless tobacco: None  . Alcohol Use: No    Review of Systems Constitutional: Chills and general weakness Eyes: No visual changes. ENT: No sore throat. Cardiovascular: Denies chest pain. Respiratory: See history of present illness, no wheezing Gastrointestinal: No abdominal pain.  No nausea, no vomiting.  No diarrhea.  No constipation. Genitourinary: Negative for dysuria. Musculoskeletal: Negative for back pain. Skin: Negative for rash. Neurological: Negative for headaches, focal weakness or numbness.  10-point ROS otherwise  negative.  ____________________________________________   PHYSICAL EXAM:  VITAL SIGNS: ED Triage Vitals  Enc Vitals Group     BP 03-08-2015 1010 154/55 mmHg     Pulse Rate 03/08/2015 1010 78     Resp --      Temp 2015/03/08 1010 98 F (36.7 C)     Temp Source Mar 08, 2015 1010 Oral     SpO2 03/08/2015 1010 98 %     Weight 2015-03-08 1010 230 lb 8 oz (104.554 kg)     Height 2015/03/08 1010  (1.778 m)     Head Cir --      Peak Flow --      Pain Score --      Pain Loc --      Pain Edu? --      Excl. in GC? --    Constitutional: Alert and oriented. Fatigued appearing, in no acute distress but does appear moderately ill. Eyes: Conjunctivae are normal. PERRL. EOMI. Head: Atraumatic. Nose: No congestion/rhinnorhea. Mouth/Throat: Mucous membranes are moist.  Oropharynx non-erythematous. Neck: No stridor.   Cardiovascular: Normal rate, regular rhythm. Grossly normal heart sounds.  Good peripheral circulation. Respiratory: Mild tachypnea, rales noted in the right lower lobe without wheezing. Mild use of accessory muscles.  Gastrointestinal: Soft and nontender. No distention. No abdominal bruits. No CVA tenderness. Musculoskeletal: Bilateral lower extremity pitting edema  No joint effusions. Neurologic:  Normal speech and language. No gross focal neurologic deficits are appreciated. Skin:  Skin is warm, dry and intact. No rash noted. Psychiatric: Mood and affect are normal. Speech and behavior are normal.  ____________________________________________   LABS (all labs ordered are listed, but only abnormal results are displayed)  Labs Reviewed  BASIC METABOLIC PANEL - Abnormal; Notable for the following:    Sodium 129 (*)    Chloride 90 (*)    Glucose, Bld 165 (*)    BUN 23 (*)    Creatinine, Ser 0.57 (*)    All other components within normal limits  CBC - Abnormal; Notable for the following:    RBC 3.92 (*)    Hemoglobin 12.5 (*)    HCT 37.3 (*)    RDW 15.2 (*)    All other  components within normal limits  BRAIN NATRIURETIC PEPTIDE - Abnormal; Notable for the following:    B Natriuretic Peptide 375.0 (*)    All other components within normal limits  CULTURE, BLOOD (ROUTINE X 2)  CULTURE, BLOOD (ROUTINE X 2)  URINALYSIS COMPLETEWITH MICROSCOPIC ([facility] ONLY)   ____________________________________________  EKG  Reviewed and interpreted by me at 10:25 AM Heart rate 80 QRS 200 QTc 550 Right bundle-branch block, wide QRS complex, as compared with previous EKG from September no acute abnormalities noted ____________________________________________  RADIOLOGY  DG Chest 2 View (Final result) Result time: 2015-03-25 12:08:44   Final result by Rad Results In Interface (2015-03-25 12:08:44)   Narrative:   CLINICAL DATA: Chronic AFib, confusion today. Increasing weakness.  EXAM: CHEST 2 VIEW  COMPARISON: Chest x-ray dated 10/19/2013.  FINDINGS: New airspace opacity at the right lung base could represent pneumonia or asymmetric edema. Probable atelectasis and/or small effusion at the left lung base. Mild cardiomegaly is stable. Median sternotomy wires appear intact and stable in alignment.  IMPRESSION: 1. New confluent airspace opacity at the right lung base, pneumonia versus asymmetric edema. Aspiration pneumonitis would be an additional possibility. 2. Probable atelectasis and/or small effusion at the left lung base. 3. Stable cardiomegaly.   Electronically Signed By: Bary Richard M.D. On: 03-25-15 12:08    ____________________________________________   PROCEDURES  Procedure(s) performed: None  Critical Care performed: No  ____________________________________________   INITIAL IMPRESSION / ASSESSMENT AND PLAN / ED COURSE  Pertinent labs & imaging results that were available during my care of the patient were reviewed by me and considered in my medical decision making (see chart for details).  Patient hypoxic on room air,  corrects with oxygen. Does appear to have anasarca by clinical exam, but also rales in the right lower lobe with associated cough and chills. Somewhat hard to differentiate if the patient has acute congestive heart failure on top of possible pneumonia or a combination of the 2, potentially due to an isolation. Discussed with Dr. Judithann Sheen and given his hypoxia will admit to the hospital for further workup. Dr. Judithann Sheen requested CT without contrast to further evaluate and delineate the 2 which is very reasonable. Dr. Judithann Sheen will follow-up on CT and further treatment. Does not have leukocytosis or fever in the ER arguing against pneumonia, however given the right lower lobe opacity seen on chest x-ray would consider the possibility of pneumonia versus asymmetric metric congestive heart failure.  ----------------------------------------- 12:50 PM on March 25, 2015 -----------------------------------------  Patient resting, mild use of accessory muscles in no distress. He understands planned for admission and CT scan. Overall appears unchanged. Continue to await CT findings to assist in treatment planning for antibiotics versus possible diuresis. ____________________________________________   FINAL CLINICAL IMPRESSION(S) / ED DIAGNOSES  Final diagnoses:  Congestive heart failure, unspecified congestive heart failure chronicity, unspecified congestive heart failure type (HCC)  Hypoxia  Cough      Sharyn Creamer, MD March 25, 2015 1250

## 2015-02-26 NOTE — H&P (Signed)
History and Physical    James [name] UJW:[id number] DOB: 1933/12/19 DOA: 02/22/2015  Referring physician: Dr. Fanny Bien PCP: [facility], Madaline Guthrie, MD  Specialists: Dr. Lady Gary  Chief Complaint: confusion, cough, and weakness  HPI: James [name] is a 82 y.o. male has a past medical history significant for ASCVD s/p CABG, DM, a-fib on coumadin, COPD on O2 and chronic CHF now with fever, weakness, cough, and confusion. In ER, pt was markedly hypoxic with pneumonia on CXR. He is still somewhat confused but feeling better on O2. Denies Cp. He is now admitted. Remains on coumadin for A-fib. Followed closely by Cardiology. He has had anorexia at home. Denies N/V/D. No bleeding  Review of Systems: The patient denies  weight loss,, vision loss, decreased hearing, hoarseness, chest pain, syncope, dyspnea on exertion, peripheral edema, balance deficits, hemoptysis, abdominal pain, melena, hematochezia, severe indigestion/heartburn, hematuria, incontinence, genital sores, muscle weakness, suspicious skin lesions, transient blindness, difficulty walking, depression, unusual weight change, abnormal bleeding, enlarged lymph nodes, angioedema, and breast masses.   Past Medical History  Diagnosis Date  . Hypertension   . CHF (congestive heart failure) (HCC)   . Diabetes mellitus without complication (HCC)     diet controlled   Past Surgical History  Procedure Laterality Date  . Cardiac surgery     Social History:  reports that he has never smoked. He does not have any smokeless tobacco history on file. He reports that he does not drink alcohol or use illicit drugs.  Allergies  Allergen Reactions  . Sulfa Antibiotics Rash    FH: positive for TB, CAD, HTN, migraines, and DM  Prior to Admission medications   Medication Sig Start Date End Date Taking? Authorizing Provider  acetaminophen (TYLENOL) 500 MG tablet Take 1,000 mg by mouth every 6 (six) hours as needed. For pain.   Yes Historical Provider, MD   amiodarone (PACERONE) 200 MG tablet Take 400 mg by mouth every morning.  01/13/15  Yes Historical Provider, MD  aspirin EC 81 MG tablet Take 81 mg by mouth every morning.   Yes Historical Provider, MD  diltiazem (CARDIZEM CD) 300 MG 24 hr capsule Take 300 mg by mouth every morning. 01/13/15  Yes Historical Provider, MD  doxycycline (VIBRA-TABS) 100 MG tablet Take 100 mg by mouth 2 (two) times daily. X 10 days. 02/24/15  Yes Historical Provider, MD  furosemide (LASIX) 40 MG tablet Take 40 mg by mouth daily as needed. For edema. 01/13/15  Yes Historical Provider, MD  potassium chloride SA (K-DUR,KLOR-CON) 20 MEQ tablet Take 20 mEq by mouth daily as needed.  Take when taking Furosemide. 01/13/15  Yes Historical Provider, MD  warfarin (COUMADIN) 1 MG tablet Take 1 mg by mouth every Wednesday. 01/13/15  Yes Historical Provider, MD  warfarin (COUMADIN) 2 MG tablet Take 2 mg by mouth See admin instructions. Take 1 tablet by mouth on Sunday, Monday, Tuesday, Thursday, Friday, and Saturday. 01/13/15  Yes Historical Provider, MD   Physical Exam: Filed Vitals:   02/06/2015 1010 02/23/2015 1140  BP: 154/55   Pulse: 78   Temp: 98 F (36.7 C)   TempSrc: Oral   Height:  (1.778 m)   Weight: 104.554 kg (230 lb 8 oz)   SpO2: 98% 82%     General:  Confused in mild respiratory distress  Eyes: PERRL, EOMI, no scleral icterus, conjunctiva clear  ENT: dry oropharynx with poor dentition  Neck: supple, no lymphadenopathy. No JVD or thyromegaly  Cardiovascular: irregularly irregular with 2/6 systolic murmur  noted; 2+ peripheral pulses, no JVD, 1+ peripheral edema  Respiratory: decreased breath sounds at right base with scattered rhonchi and faint basilar rales  Abdomen: soft, non tender to palpation, positive bowel sounds, no guarding, no rebound  Skin: no rashes or lesions, warm and dry  Musculoskeletal: normal bulk and tone, no joint swelling  Psychiatric: normal mood and affect  Neurologic: CN 2-12  grossly intact, Motor strength 5/5 in all 4 muscle groups. Sensory exam normal. DTR's normal  Labs on Admission:  Basic Metabolic Panel:  Recent Labs Lab 02/25/2015 1019  NA 129*  K 3.8  CL 90*  CO2 29  GLUCOSE 165*  BUN 23*  CREATININE 0.57*  CALCIUM 9.1   Liver Function Tests: No results for input(s): AST, ALT, ALKPHOS, BILITOT, PROT, ALBUMIN in the last 168 hours. No results for input(s): LIPASE, AMYLASE in the last 168 hours. No results for input(s): AMMONIA in the last 168 hours. CBC:  Recent Labs Lab 03/07/2015 1019  WBC 10.6  HGB 12.5*  HCT 37.3*  MCV 95.0  PLT 168   Cardiac Enzymes: No results for input(s): CKTOTAL, CKMB, CKMBINDEX, TROPONINI in the last 168 hours.  BNP (last 3 results)  Recent Labs  03/06/2015 1019  BNP 375.0*    ProBNP (last 3 results) No results for input(s): PROBNP in the last 8760 hours.  CBG: No results for input(s): GLUCAP in the last 168 hours.  Radiological Exams on Admission: Dg Chest 2 View  03/06/2015  CLINICAL DATA:  Chronic AFib, confusion today.  Increasing weakness. EXAM: CHEST  2 VIEW COMPARISON:  Chest x-ray dated 10/19/2013. FINDINGS: New airspace opacity at the right lung base could represent pneumonia or asymmetric edema. Probable atelectasis and/or small effusion at the left lung base. Mild cardiomegaly is stable. Median sternotomy wires appear intact and stable in alignment. IMPRESSION: 1. New confluent airspace opacity at the right lung base, pneumonia versus asymmetric edema. Aspiration pneumonitis would be an additional possibility. 2. Probable atelectasis and/or small effusion at the left lung base. 3. Stable cardiomegaly. Electronically Signed   By: Bary Richard M.D.   On: 02/10/2015 12:08    EKG: Independently reviewed.  Assessment/Plan Principal Problem:   Acute on chronic respiratory failure (HCC) Active Problems:   Chronic diastolic CHF (congestive heart failure) (HCC)   CAP (community acquired  pneumonia)   Atrial fibrillation (HCC)   COPD exacerbation (HCC)   Will admit to floor with telemetry and O2. Begin IV ABX, IV fluids,  and SVN's. Follow cardiac enzymes. Order echo and Cardiology consult. F/u labs and CXR in AM. PT and CSW evals ordered.  Diet: soft Fluids: NS DVT Prophylaxis: SQ Heparin  Code Status: FULL  Family Communication: yes  Disposition Plan: home  Time spent: 50 min

## 2015-02-26 NOTE — Progress Notes (Signed)
ANTIBIOTIC CONSULT NOTE - INITIAL  Pharmacy Consult for Levaquin  Indication: pneumonia  Allergies  Allergen Reactions  . Sulfa Antibiotics Rash    Patient Measurements: Height:  (177.8 cm) Weight: 230 lb 8 oz (104.554 kg) IBW/kg (Calculated) : 73 Adjusted Body Weight:   Vital Signs: Temp: 98 F (36.7 C) (01/21 1010) Temp Source: Oral (01/21 1010) BP: 154/55 mmHg (01/21 1010) Pulse Rate: 78 (01/21 1010) Intake/Output from previous day:   Intake/Output from this shift:    Labs:  Recent Labs  17-Mar-2015 1019  WBC 10.6  HGB 12.5*  PLT 168  CREATININE 0.57*   Estimated Creatinine Clearance: 90.7 mL/min (by C-G formula based on Cr of 0.57). No results for input(s): VANCOTROUGH, VANCOPEAK, VANCORANDOM, GENTTROUGH, GENTPEAK, GENTRANDOM, TOBRATROUGH, TOBRAPEAK, TOBRARND, AMIKACINPEAK, AMIKACINTROU, AMIKACIN in the last 72 hours.   Microbiology: No results found for this or any previous visit (from the past 720 hour(s)).  Medical History: Past Medical History  Diagnosis Date  . Hypertension   . CHF (congestive heart failure) (HCC)   . Diabetes mellitus without complication (HCC)     diet controlled    Medications:  Scheduled:  . ipratropium-albuterol  3 mL Nebulization QID   Assessment: CrCl = 90.7 ml/min   Goal of Therapy:  resolution of infection   Plan:  Expected duration 7 days with resolution of temperature and/or normalization of WBC   Levaquin 500 mg IV Q24H ordered.    Tashawn Laswell D 2015-03-17,1:12 PM

## 2015-02-27 ENCOUNTER — Inpatient Hospital Stay: Payer: Medicare Other

## 2015-02-27 ENCOUNTER — Inpatient Hospital Stay
Admit: 2015-02-27 | Discharge: 2015-02-27 | Disposition: A | Discharge status: Home / Self Care | Payer: Medicare Other | Attending: Internal Medicine | Admitting: Internal Medicine

## 2015-02-27 DIAGNOSIS — J9622 Acute and chronic respiratory failure with hypercapnia: Secondary | ICD-10-CM

## 2015-02-27 DIAGNOSIS — I10 Essential (primary) hypertension: Secondary | ICD-10-CM

## 2015-02-27 DIAGNOSIS — J9620 Acute and chronic respiratory failure, unspecified whether with hypoxia or hypercapnia: Secondary | ICD-10-CM

## 2015-02-27 DIAGNOSIS — L8990 Pressure ulcer of unspecified site, unspecified stage: Secondary | ICD-10-CM | POA: Insufficient documentation

## 2015-02-27 LAB — COMPREHENSIVE METABOLIC PANEL
ALK PHOS: 64 U/L (ref 38–126)
ALT: 374 U/L — AB (ref 17–63)
AST: 319 U/L — AB (ref 15–41)
Albumin: 3.2 g/dL — ABNORMAL LOW (ref 3.5–5.0)
Anion gap: 7 (ref 5–15)
BUN: 26 mg/dL — AB (ref 6–20)
CALCIUM: 8.6 mg/dL — AB (ref 8.9–10.3)
CHLORIDE: 93 mmol/L — AB (ref 101–111)
CO2: 29 mmol/L (ref 22–32)
CREATININE: 0.62 mg/dL (ref 0.61–1.24)
GFR calc non Af Amer: >60 mL/min (ref >60)
Glucose, Bld: 156 mg/dL — ABNORMAL HIGH (ref 65–99)
Potassium: 3.9 mmol/L (ref 3.5–5.1)
SODIUM: 129 mmol/L — AB (ref 135–145)
Total Bilirubin: 1.6 mg/dL — ABNORMAL HIGH (ref 0.3–1.2)
Total Protein: 6.7 g/dL (ref 6.5–8.1)

## 2015-02-27 LAB — BLOOD GAS, ARTERIAL
ACID-BASE EXCESS: 2.2 mmol/L (ref 0.0–3.0)
Allens test (pass/fail): POSITIVE — AB
BICARBONATE: 32.8 meq/L — AB (ref 21.0–28.0)
FIO2: 0.32
O2 SAT: 95.7 %
PCO2 ART: 84 mmHg — AB (ref 32.0–48.0)
PH ART: 7.2 — AB (ref 7.350–7.450)
PO2 ART: 96 mmHg (ref 83.0–108.0)
Patient temperature: 37

## 2015-02-27 LAB — CBC
HCT: 37.7 % — ABNORMAL LOW (ref 40.0–52.0)
Hemoglobin: 12.4 g/dL — ABNORMAL LOW (ref 13.0–18.0)
MCH: 32 pg (ref 26.0–34.0)
MCHC: 33 g/dL (ref 32.0–36.0)
MCV: 97.1 fL (ref 80.0–100.0)
PLATELETS: 179 10*3/uL (ref 150–440)
RBC: 3.88 MIL/uL — ABNORMAL LOW (ref 4.40–5.90)
RDW: 15.6 % — AB (ref 11.5–14.5)
WBC: 12.3 10*3/uL — ABNORMAL HIGH (ref 3.8–10.6)

## 2015-02-27 LAB — PROCALCITONIN: Procalcitonin: <0.1 ng/mL

## 2015-02-27 LAB — GLUCOSE, CAPILLARY
GLUCOSE-CAPILLARY: 152 mg/dL — AB (ref 65–99)
Glucose-Capillary: 152 mg/dL — ABNORMAL HIGH (ref 65–99)
Glucose-Capillary: 145 mg/dL — ABNORMAL HIGH (ref 65–99)
Glucose-Capillary: 110 mg/dL — ABNORMAL HIGH (ref 65–99)

## 2015-02-27 LAB — PROTIME-INR
INR: 1.75
PROTHROMBIN TIME: 20.4 s — AB (ref 11.4–15.0)

## 2015-02-27 LAB — TROPONIN I: TROPONIN I: 0.03 ng/mL (ref <0.031)

## 2015-02-27 MED ORDER — CETYLPYRIDINIUM CHLORIDE 0.05 % MT LIQD: 7.0000 mL | Freq: Two times a day (BID) | OROMUCOSAL | Status: DC

## 2015-02-27 MED ORDER — FUROSEMIDE 10 MG/ML IJ SOLN
40.0000 mg | Freq: Once | INTRAMUSCULAR | Status: AC
  Administered 2015-02-27: 40 mg via INTRAVENOUS
  Filled 2015-02-27: qty 4

## 2015-02-27 MED ORDER — INSULIN ASPART 100 UNIT/ML ~~LOC~~ SOLN: 0.0000 [IU] | SUBCUTANEOUS | Status: DC

## 2015-02-27 MED ORDER — WARFARIN - PHYSICIAN DOSING INPATIENT: Freq: Every day | Status: DC

## 2015-02-27 MED ORDER — IPRATROPIUM-ALBUTEROL 0.5-2.5 (3) MG/3ML IN SOLN
3.0000 mL | Freq: Four times a day (QID) | RESPIRATORY_TRACT | Status: DC
  Administered 2015-02-27 (×2): 3 mL via RESPIRATORY_TRACT
  Filled 2015-02-27 (×2): qty 3

## 2015-02-27 MED ORDER — PIPERACILLIN-TAZOBACTAM 4.5 G IVPB
4.5000 g | Freq: Three times a day (TID) | INTRAVENOUS | Status: DC
  Administered 2015-02-27: 4.5 g via INTRAVENOUS
  Filled 2015-02-27 (×5): qty 100

## 2015-02-27 MED ORDER — WARFARIN SODIUM 1 MG PO TABS: 2.5000 mg | ORAL_TABLET | Freq: Every day | ORAL | Status: DC

## 2015-02-27 NOTE — Progress Notes (Signed)
Nurse reports the patient had worsening mental status. I ordered ABG whichshows is severe respiratory acidosis. I have spoken with the patient's wife and she declares that patient is DO NOT RESUSCITATE. He has already signed paperwork previously. She realizes patient is critically ill. I mentioned trying BiPAP machine which she is agreeable to. Patient will need to be transferred to critical care unit once a bed is available. We will start BiPAP machine while the patient is on telemetry. I will broaden antibiotics coverage with Zosyn and Levaquin. I have already spoken with the intensivist on call who is aware of the patient.  Critical care time spent 30 minutes

## 2015-02-27 NOTE — Progress Notes (Signed)
Per respiratory, patient increased WOB more labored, O2 requirements increased to 3L overnight maintaing SpO2 at 90-91%. MD at bedside currently and updated. Orders to d/c fluids.

## 2015-02-27 NOTE — Progress Notes (Signed)
Called by patient's nurse, order's had been written to go ahead and place patient on BIPAP while still in current room.  Once BIPAP had been placed we transferred patient to ICU 11.

## 2015-02-27 NOTE — Consult Note (Signed)
PULMONARY CONSULT NOTE  Requesting MD/Service: Mody Date of consult: 01/22 Reason for consultation: Acute on chronic respiratory failure  HPI:  Hx provided by pt's wife as pt is unresponsive due to hypercarbia. 25 M with severe cardiomyopathy admitted 01/21 with increasing dyspnea and chest congestion. He has had similar symptoms frequently in the past and is usually treated with doxycycline. This is the most severe episode. His wife indicates that he has had no fevers. He was admitted 01/21 with a diagnosis of PNA after CT chest revealed patchy bilateral infiltrates. At baseline, the patient is markedly limited in exercise tolerance requiring a walker for assistance and unable to go further than across the room. He is predominantly limited by fatigue. His wife indicates that he has been DNR for a long time. After ABG revealed severe hypercarbia, the patient was placed on BiPAP prior to my initial evaluation. The patient's wife indicates that even BiPAP might exceed his previously expressed wishes  Past Medical History  Diagnosis Date  . Hypertension   . CHF (congestive heart failure) (HCC)   . Diabetes mellitus without complication (HCC)     diet controlled    Past Surgical History  Procedure Laterality Date  . Cardiac surgery      MEDICATIONS: I have reviewed all medications and confirmed regimen as documented  Social History   Social History  . Marital Status: Married    Spouse Name: N/A  . Number of Children: N/A  . Years of Education: N/A   Occupational History  . Not on file.   Social History Main Topics  . Smoking status: Never Smoker   . Smokeless tobacco: Not on file  . Alcohol Use: No  . Drug Use: No  . Sexual Activity: No   Other Topics Concern  . Not on file   Social History Narrative  . No narrative on file    History reviewed. No pertinent family history.  ROS: Unable to obtain   Filed Vitals:   02/27/15 1100 02/27/15 1125 02/27/15 1135 02/27/15  1344  BP:      Pulse:   82   Temp:  97.5 F (36.4 C)    TempSrc:  Axillary    Resp:   22   Height:      Weight:      SpO2: 92%  90% 95%     EXAM:  Gen: RASS -4 on BiPAP. No overt distress HEENT: NCAT Neck: + JVD Lungs: breath sounds markedly diminished, no wheezes, bilateral crackles Cardiovascular: Reg, no murmur noted Abdomen: Soft, nontender, normal BS Ext: chronic stasis changes, symmetric edema Neuro: MAEs, DTRs symmetric  DATA:   BMP Latest Ref Rng 02/27/2015 03/01/2015 10/21/2013  Glucose 65 - 99 mg/dL 161(W) 960(A) 540(J)  BUN 6 - 20 mg/dL 81(X) 91(Y) 16  Creatinine 0.61 - 1.24 mg/dL 7.82 9.56(O) 1.30  Sodium 135 - 145 mmol/L 129(L) 129(L) 137  Potassium 3.5 - 5.1 mmol/L 3.9 3.8 3.3(L)  Chloride 101 - 111 mmol/L 93(L) 90(L) 96(L)  CO2 22 - 32 mmol/L Calcium 8.9 - 10.3 mg/dL 8.6(V) 9.1 7.8(I)    CBC Latest Ref Rng 02/27/2015 02/13/2015 10/20/2013  WBC 3.8 - 10.6 K/uL 12.3(H) 10.6 6.0  Hemoglobin 13.0 - 18.0 g/dL 12.4(L) 12.5(L) 11.3(L)  Hematocrit 40.0 - 52.0 % 37.7(L) 37.3(L) 35.3(L)  Platelets 150 - 440 K/uL 179 168 120(L)   Results for orders placed or performed during the hospital encounter of 03/01/2015  Culture, blood (Routine X 2) w Reflex  to ID Panel     Status: None (Preliminary result)   Collection Time: 03/03/2015 11:20 AM  Result Value Ref Range Status   Specimen Description BLOOD RIGHT ANTECUBITAL  Final   Special Requests BOTTLES DRAWN AEROBIC AND ANAEROBIC  1CC  Final   Culture NO GROWTH < 24 HOURS  Final   Report Status PENDING  Incomplete  Culture, blood (Routine X 2) w Reflex to ID Panel     Status: None (Preliminary result)   Collection Time: 03/03/2015 12:07 PM  Result Value Ref Range Status   Specimen Description BLOOD RIGHT ARM  Final   Special Requests BOTTLES DRAWN AEROBIC AND ANAEROBIC  1CC  Final   Culture NO GROWTH < 24 HOURS  Final   Report Status PENDING  Incomplete   Anti-infectives    Start     Dose/Rate Route Frequency  Ordered Stop   02/27/15 1400  piperacillin-tazobactam (ZOSYN) IVPB 4.5 g     4.5 g 25 mL/hr over 240 Minutes Intravenous 3 times per day 02/27/15 1043     02/07/2015 1315  levofloxacin (LEVAQUIN) IVPB 500 mg     500 mg 100 mL/hr over 60 Minutes Intravenous Every 24 hours 02/27/2015 1312     02/12/2015 1230  levofloxacin (LEVAQUIN) IVPB 750 mg  Status:  Discontinued     750 mg 100 mL/hr over 90 Minutes Intravenous  Once 02/17/2015 1225 02/14/2015 1229       CXR:  Increased bilateral AS dz EKG: probable NSR, RBBB pattern Echocardiogram 02/27/15:  Result pending  IMPRESSION:   Acute on chronic hypercarbic respiratory failure ES cardiomyopathy PAF Suspected PNA Increased pulmonary infiltrates - suspect worsening pulmonary edema Acute AMS - likely hypercarbic encephalopathy DNR appropriate due to ES cardiomyopathy  PLAN:  -Continue current abx -Cont BiPAP as long as he is tolerating without it contributing to his discomfort -Lasix X 1 dose. Recheck CXR AM 01/23 -CT head ordered -His wife has expressed concern that the pt would not even want to be supported with BiPAP. I suggested that since it has been initiated, we continue for now as long as it is not contributing to his discomfort. She would not want him to be left on BiPAP more than 24 hrs. If no better by AM 01/23, would consider stopping and providing full comfort measures   Merwyn Katos, MD [facility], Critical Care Medicine

## 2015-02-27 NOTE — Progress Notes (Addendum)
[facility] - [place] at [facility]   PATIENT NAME: James [name]    MR#:  [id number]  DATE OF BIRTH:  [birth year]  SUBJECTIVE:  Patient's O2 was increased from 2-3 L. Patient opens his eyes and talks but for the most part is sleepy.  REVIEW OF SYSTEMS:    Review of Systems  Constitutional: Positive for fever and malaise/fatigue. Negative for chills.  HENT: Negative for sore throat.   Eyes: Negative for blurred vision.  Respiratory: Positive for cough, sputum production, shortness of breath and wheezing. Negative for hemoptysis.   Cardiovascular: Negative for chest pain, palpitations and leg swelling.  Gastrointestinal: Negative for nausea, vomiting, abdominal pain, diarrhea and blood in stool.  Genitourinary: Negative for dysuria.  Musculoskeletal: Negative for back pain.  Neurological: Positive for weakness. Negative for dizziness, tremors and headaches.  Endo/Heme/Allergies: Does not bruise/bleed easily.    Tolerating Diet: yes      DRUG ALLERGIES:   Allergies  Allergen Reactions  . Sulfa Antibiotics Rash    VITALS:  Blood pressure 150/54, pulse 76, temperature 97.8 F (36.6 C), temperature source Oral, resp. rate 18, height  (1.88 m), weight 97.024 kg (213 lb 14.4 oz), SpO2 91 %.  PHYSICAL EXAMINATION:   Physical Exam  Constitutional: He is well-developed, well-nourished, and in no distress. No distress.  HENT:  Head: Normocephalic.  Eyes: No scleral icterus.  Neck: Neck supple. No JVD present. No tracheal deviation present.  Cardiovascular: Normal rate, regular rhythm and normal heart sounds.  Exam reveals no gallop and no friction rub.   No murmur heard. Pulmonary/Chest: Effort normal and breath sounds normal. No respiratory distress. He has no wheezes. He has no rales. He exhibits no tenderness.  Bilateral coarse breath sounds  Abdominal: Soft. Bowel sounds are normal. He exhibits no distension and no mass. There is no  tenderness. There is no rebound and no guarding.  Musculoskeletal: Normal range of motion. He exhibits no edema.  Neurological:  He opens his eyes but appears more lethargic  Skin: Skin is warm. No rash noted. No erythema.      LABORATORY PANEL:   CBC  Recent Labs Lab 02/27/15 0436  WBC 12.3*  HGB 12.4*  HCT 37.7*  PLT 179   ------------------------------------------------------------------------------------------------------------------  Chemistries   Recent Labs Lab 02/27/15 0436  NA 129*  K 3.9  CL 93*  CO2 29  GLUCOSE 156*  BUN 26*  CREATININE 0.62  CALCIUM 8.6*  AST 319*  ALT 374*  ALKPHOS 64  BILITOT 1.6*   ------------------------------------------------------------------------------------------------------------------  Cardiac Enzymes  Recent Labs Lab 2015/03/18 1640 March 18, 2015 2217 02/27/15 0436  TROPONINI 0.03 <0.03 0.03   ------------------------------------------------------------------------------------------------------------------  RADIOLOGY:  Dg Chest 2 View  2015/03/18  CLINICAL DATA:  Chronic AFib, confusion today.  Increasing weakness. EXAM: CHEST  2 VIEW COMPARISON:  Chest x-ray dated 10/19/2013. FINDINGS: New airspace opacity at the right lung base could represent pneumonia or asymmetric edema. Probable atelectasis and/or small effusion at the left lung base. Mild cardiomegaly is stable. Median sternotomy wires appear intact and stable in alignment. IMPRESSION: 1. New confluent airspace opacity at the right lung base, pneumonia versus asymmetric edema. Aspiration pneumonitis would be an additional possibility. 2. Probable atelectasis and/or small effusion at the left lung base. 3. Stable cardiomegaly. Electronically Signed   By: Bary Richard M.D.   On: 03-18-15 12:08   Ct Chest Wo Contrast  03-18-2015  CLINICAL DATA:  82 year old male with fever, weakness, cough and confusion. Right lower lobe  pneumonia on chest x-ray earlier today. EXAM:  CT CHEST WITHOUT CONTRAST TECHNIQUE: Multidetector CT imaging of the chest was performed following the standard protocol without IV contrast. COMPARISON:  Chest x-ray 03-01-2015 FINDINGS: Mediastinum: Motion artifact is present which could exaggerates measurements. Low right paratracheal lymph node mildly enlarged at 1.4 cm. Question of rounded soft tissue in the right hilum which could represent additional adenopathy or mass versus vascular structures and motion artifact unremarkable thoracic aorta. Visualized thyroid gland and thoracic inlet are unremarkable. No supraclavicular adenopathy. Heart/Vascular: Limited evaluation in the absence of intravenous contrast. Conventional 3 vessel arch anatomy. Scattered calcifications including throughout the coronary arteries. Prosthetic aortic valve. Cardiomegaly with right atrial enlargement. No pericardial effusion. Lungs/Pleura: Calcified pleural plaque along the medial aspect of the right thorax. Small to moderate layering left pleural effusion. Patchy airspace opacity in a peribronchovascular distribution in the anterior aspect of the left upper lobe, and throughout the right middle and lower lobes. Respiratory motion artifact limits evaluation for underlying nodularity. Probable atelectasis in the left lower lobe. Bones/Soft Tissues: Healed median sternotomy. No acute fracture or aggressive appearing lytic or blastic osseous lesion. Upper Abdomen: Circumscribed water density 3.8 cm cyst in the liver. Sludge and stones present within the gallbladder lumen. Otherwise, imaged upper abdomen is unremarkable. IMPRESSION: 1. Patchy multifocal airspace opacities throughout the right middle and lower lobes as well as in the anterior aspect of the left upper lobe. Findings are concerning for multifocal pneumonia. Asymmetric pulmonary edema is possible but considered less likely. 2. Mildly enlarged right mediastinal lymph node and possible right hilar lymphadenopathy versus mass  versus a combination of motion artifact and underlying vascular structures. Favor a combination of reactive adenopathy and motion artifact. However, recommend repeat CT scan of the chest with contrast in 2-4 weeks after appropriate course of therapy to evaluate for underlying lymphadenopathy or hilar mass. 3. Small to moderate layering left pleural effusion. 4. Chronic calcified pleural plaque along the medial and posterior aspect of the right thorax. 5. Atherosclerosis including coronary artery calcifications. 6. Prosthetic aortic valve. 7. Cardiomegaly with right atrial enlargement. 8. Hepatic cyst. 9. Cholelithiasis. Electronically Signed   By: Malachy Moan M.D.   On: 03-01-15 14:00     ASSESSMENT AND PLAN:   82 year old male with a history of ASCVD status post CABG, atrial fibrillation on Coumadin, COPD on home oxygen and diabetes who presented with confusion, cough and weakness.  1. Acute on chronic respiratory failure, hypoxia: This is due to community-acquired pneumonia. CT scan shows patchy multifocal airspace disease throughout the right middle and lower lobe as well as anterior aspect of left upper lobe. Findings are concerning for multifocal pneumonia. Continue Levaquin. Follow up on blood culture and I will order ABG due to patient's lethargy.  2. Hyponatremia: Sodium level is still 129. I am suspecting this is more related to underlying pneumonia.  3. History of atrial fibrillation: Heart rate is controlled on diltiazem and amiodarone. Continue Coumadin. Pharmacy to help dose.  4. Chronic diastolic heart failure: Continue Lasix.  5. Diet controlled Type 2 diabetes: Continue sliding scale insulin.  6. CAD: Continue aspirin  7. Mixed hyperlipidemia: As per cardiology this is controlled with diet.    Management plans discussed with the patient  And wife and she is in agreement.  CODE STATUS: dnr Critical care TOTAL TIME TAKING CARE OF THIS PATIENT: 35 minutes.   Patient  high risk of respiratory failure  POSSIBLE D/C 2-3 days, DEPENDING ON CLINICAL CONDITION.   Willmar Stockinger,  Daylene Vandenbosch M.D on 02/27/2015 at 10:01 AM  Between 7am to 6pm - Pager - [phone] After 6pm go to [email or web address] - password [email or web address] [facility]  [place] [facility]  Office  [phone]  CC: Primary care physician; [facility], Madaline Guthrie, MD  Note: This dictation was prepared with Dragon dictation along with smaller phrase technology. Any transcriptional errors that result from this process are unintentional.

## 2015-02-27 NOTE — Consult Note (Addendum)
Reason for Consult: shortness of breath valvular disease atrial  fibrillation Referring Physician:  Dr. Georgie Chard hospitalist  cardiologist KF  James [name] is an 82 y.o. male.  HPI:  82 year old male multiple medical problems a 2 as chronic vascular disease coronary bypass surgery mechanical mitral valve replacement on Coumadin for anticoagulation history of atrial fibrillation diabetes Chronic obstructive pulmonary disease chronic congestive heart failure appears to be diastolic presented with shortness of breath fever cough congestion and confusion. Chest x-ray suggested pneumonia placed on oxygen denied any chest pain heart rate was controlled diabetes was also reasonably controlled Cardiology was recommended because of shortness of breath history of heart  Failure and atrial fibrillation.  Past Medical History  Diagnosis Date  . Hypertension   . CHF (congestive heart failure) ([place])   . Diabetes mellitus without complication ([place])     diet controlled    Past Surgical History  Procedure Laterality Date  . Cardiac surgery      History reviewed. No pertinent family history.  Social History:  reports that he has never smoked. He does not have any smokeless tobacco history on file. He reports that he does not drink alcohol or use illicit drugs.  Allergies:  Allergies  Allergen Reactions  . Sulfa Antibiotics Rash    Medications: I have reviewed the patient's current medications.  Results for orders placed or performed during the hospital encounter of 02/09/2015 (from the past 48 hour(s))  Basic metabolic panel     Status: Abnormal   Collection Time: 02/23/2015 10:19 AM  Result Value Ref Range   Sodium 129 (L) 135 - 145 mmol/L   Potassium 3.8 3.5 - 5.1 mmol/L   Chloride 90 (L) 101 - 111 mmol/L   CO2 29 22 - 32 mmol/L   Glucose, Bld 165 (H) 65 - 99 mg/dL   BUN 23 (H) 6 - 20 mg/dL   Creatinine, Ser 0.57 (L) 0.61 - 1.24 mg/dL   Calcium 9.1 8.9 - 10.3 mg/dL   GFR calc non Af Amer  >60 >60 mL/min   GFR calc Af Amer >60 >60 mL/min    Comment: (NOTE) The eGFR has been calculated using the CKD EPI equation. This calculation has not been validated in all clinical situations. eGFR's persistently <60 mL/min signify possible Chronic Kidney Disease.    Anion gap 10 5 - 15  CBC     Status: Abnormal   Collection Time: 02/23/2015 10:19 AM  Result Value Ref Range   WBC 10.6 3.8 - 10.6 K/uL   RBC 3.92 (L) 4.40 - 5.90 MIL/uL   Hemoglobin 12.5 (L) 13.0 - 18.0 g/dL   HCT 37.3 (L) 40.0 - 52.0 %   MCV 95.0 80.0 - 100.0 fL   MCH 31.9 26.0 - 34.0 pg   MCHC 33.6 32.0 - 36.0 g/dL   RDW 15.2 (H) 11.5 - 14.5 %   Platelets 168 150 - 440 K/uL  Brain natriuretic peptide     Status: Abnormal   Collection Time: 03/05/2015 10:19 AM  Result Value Ref Range   B Natriuretic Peptide 375.0 (H) 0.0 - 100.0 pg/mL  Protime-INR     Status: Abnormal   Collection Time: 02/12/2015 10:19 AM  Result Value Ref Range   Prothrombin Time 20.1 (H) 11.4 - 15.0 seconds   INR 1.71   Culture, blood (Routine X 2) w Reflex to ID Panel     Status: None (Preliminary result)   Collection Time: 02/24/2015 11:20 AM  Result Value Ref Range  Specimen Description BLOOD RIGHT ANTECUBITAL    Special Requests BOTTLES DRAWN AEROBIC AND ANAEROBIC  1CC    Culture NO GROWTH < 24 HOURS    Report Status PENDING   Culture, blood (Routine X 2) w Reflex to ID Panel     Status: None (Preliminary result)   Collection Time: 02/10/2015 12:07 PM  Result Value Ref Range   Specimen Description BLOOD RIGHT ARM    Special Requests BOTTLES DRAWN AEROBIC AND ANAEROBIC  1CC    Culture NO GROWTH < 24 HOURS    Report Status PENDING   Urinalysis complete, with microscopic ([facility] only)     Status: Abnormal   Collection Time: 03/08/2015 12:16 PM  Result Value Ref Range   Color, Urine AMBER (A) YELLOW   APPearance HAZY (A) CLEAR   Glucose, UA 50 (A) NEGATIVE mg/dL   Bilirubin Urine NEGATIVE NEGATIVE   Ketones, ur NEGATIVE NEGATIVE mg/dL    Specific Gravity, Urine 1.029 1.005 - 1.030   Hgb urine dipstick 1+ (A) NEGATIVE   pH 5.0 5.0 - 8.0   Protein, ur >500 (A) NEGATIVE mg/dL   Nitrite NEGATIVE NEGATIVE   Leukocytes, UA NEGATIVE NEGATIVE   RBC / HPF 6-30 0 - 5 RBC/hpf   WBC, UA TOO NUMEROUS TO COUNT 0 - 5 WBC/hpf   Bacteria, UA NONE SEEN NONE SEEN   Squamous Epithelial / LPF 0-5 (A) NONE SEEN   Mucous PRESENT    Budding Yeast PRESENT   Glucose, capillary     Status: Abnormal   Collection Time: 02/06/2015  4:35 PM  Result Value Ref Range   Glucose-Capillary 133 (H) 65 - 99 mg/dL  Troponin I     Status: None   Collection Time: 03/07/2015  4:40 PM  Result Value Ref Range   Troponin I 0.03 <0.031 ng/mL    Comment:        NO INDICATION OF MYOCARDIAL INJURY.   Glucose, capillary     Status: Abnormal   Collection Time: 02/15/2015  9:06 PM  Result Value Ref Range   Glucose-Capillary 156 (H) 65 - 99 mg/dL  Troponin I     Status: None   Collection Time: 02/13/2015 10:17 PM  Result Value Ref Range   Troponin I <0.03 <0.031 ng/mL    Comment:        NO INDICATION OF MYOCARDIAL INJURY.   Troponin I     Status: None   Collection Time: 02/27/15  4:36 AM  Result Value Ref Range   Troponin I 0.03 <0.031 ng/mL    Comment:        NO INDICATION OF MYOCARDIAL INJURY.   Comprehensive metabolic panel     Status: Abnormal   Collection Time: 02/27/15  4:36 AM  Result Value Ref Range   Sodium 129 (L) 135 - 145 mmol/L   Potassium 3.9 3.5 - 5.1 mmol/L   Chloride 93 (L) 101 - 111 mmol/L   CO2 29 22 - 32 mmol/L   Glucose, Bld 156 (H) 65 - 99 mg/dL   BUN 26 (H) 6 - 20 mg/dL   Creatinine, Ser 0.62 0.61 - 1.24 mg/dL   Calcium 8.6 (L) 8.9 - 10.3 mg/dL   Total Protein 6.7 6.5 - 8.1 g/dL   Albumin 3.2 (L) 3.5 - 5.0 g/dL   AST 319 (H) 15 - 41 U/L   ALT 374 (H) 17 - 63 U/L   Alkaline Phosphatase 64 38 - 126 U/L   Total Bilirubin 1.6 (H) 0.3 - 1.2  mg/dL   GFR calc non Af Amer >60 >60 mL/min   GFR calc Af Amer >60 >60 mL/min    Comment:  (NOTE) The eGFR has been calculated using the CKD EPI equation. This calculation has not been validated in all clinical situations. eGFR's persistently <60 mL/min signify possible Chronic Kidney Disease.    Anion gap 7 5 - 15  CBC     Status: Abnormal   Collection Time: 02/27/15  4:36 AM  Result Value Ref Range   WBC 12.3 (H) 3.8 - 10.6 K/uL   RBC 3.88 (L) 4.40 - 5.90 MIL/uL   Hemoglobin 12.4 (L) 13.0 - 18.0 g/dL   HCT 37.7 (L) 40.0 - 52.0 %   MCV 97.1 80.0 - 100.0 fL   MCH 32.0 26.0 - 34.0 pg   MCHC 33.0 32.0 - 36.0 g/dL   RDW 15.6 (H) 11.5 - 14.5 %   Platelets 179 150 - 440 K/uL  Protime-INR     Status: Abnormal   Collection Time: 02/27/15  4:36 AM  Result Value Ref Range   Prothrombin Time 20.4 (H) 11.4 - 15.0 seconds   INR 1.75   Procalcitonin - Baseline     Status: None   Collection Time: 02/27/15  4:36 AM  Result Value Ref Range   Procalcitonin <0.10 ng/mL    Comment:        Interpretation: PCT (Procalcitonin) <= 0.5 ng/mL: Systemic infection (sepsis) is not likely. Local bacterial infection is possible. (NOTE)         ICU PCT Algorithm               Non ICU PCT Algorithm    ----------------------------     ------------------------------         PCT < 0.25 ng/mL                 PCT < 0.1 ng/mL     Stopping of antibiotics            Stopping of antibiotics       strongly encouraged.               strongly encouraged.    ----------------------------     ------------------------------       PCT level decrease by               PCT < 0.25 ng/mL       >= 80% from peak PCT       OR PCT 0.25 - 0.5 ng/mL          Stopping of antibiotics                                             encouraged.     Stopping of antibiotics           encouraged.    ----------------------------     ------------------------------       PCT level decrease by              PCT >= 0.25 ng/mL       < 80% from peak PCT        AND PCT >= 0.5 ng/mL            Continuin g antibiotics  encouraged.       Continuing antibiotics            encouraged.    ----------------------------     ------------------------------     PCT level increase compared          PCT > 0.5 ng/mL         with peak PCT AND          PCT >= 0.5 ng/mL             Escalation of antibiotics                                          strongly encouraged.      Escalation of antibiotics        strongly encouraged.   Glucose, capillary     Status: Abnormal   Collection Time: 02/27/15  7:42 AM  Result Value Ref Range   Glucose-Capillary 152 (H) 65 - 99 mg/dL  Glucose, capillary     Status: Abnormal   Collection Time: 02/27/15 10:06 AM  Result Value Ref Range   Glucose-Capillary 152 (H) 65 - 99 mg/dL  Blood gas, arterial     Status: Abnormal   Collection Time: 02/27/15 10:20 AM  Result Value Ref Range   FIO2 0.32    pH, Arterial 7.20 (L) 7.350 - 7.450   pCO2 arterial 84 (HH) 32.0 - 48.0 mmHg    Comment: CRITICAL RESULT CALLED TO, READ BACK BY AND VERIFIED WITH: MADDIE HIMES , RN AT 1031 ON [id number]    pO2, Arterial 96 83.0 - 108.0 mmHg   Bicarbonate 32.8 (H) 21.0 - 28.0 mEq/L   Acid-Base Excess 2.2 0.0 - 3.0 mmol/L   O2 Saturation 95.7 %   Patient temperature 37.0    Collection site RIGHT RADIAL    Sample type ARTERIAL DRAW    Allens test (pass/fail) POSITIVE (A) PASS  Glucose, capillary     Status: Abnormal   Collection Time: 02/27/15 11:30 AM  Result Value Ref Range   Glucose-Capillary 145 (H) 65 - 99 mg/dL    X-ray Chest Pa And Lateral  02/27/2015  CLINICAL DATA:  82 year old male with confusion shortness of breath and cough. Initial encounter. EXAM: CHEST  2 VIEW COMPARISON:  Chest CT yesterday 02/20/2015 and earlier FINDINGS: Seated AP and lateral views of the chest. Confluent calcified right pleural plaques along the spine re- demonstrated. Bilateral confluent airspace disease, progressed radiographically in the left lung since 11/30 6 hours yesterday. Small left  greater than right pleural effusions re- demonstrated. No superimposed pneumothorax. Stable cardiac size and mediastinal contours. Calcified aortic atherosclerosis. IMPRESSION: Radiographically progressed bilateral pneumonia since 1136 hours yesterday. Small bilateral pleural effusions. Electronically Signed   By: Genevie Ann M.D.   On: 02/27/2015 11:15   Dg Chest 2 View  03/03/2015  CLINICAL DATA:  Chronic AFib, confusion today.  Increasing weakness. EXAM: CHEST  2 VIEW COMPARISON:  Chest x-ray dated 10/19/2013. FINDINGS: New airspace opacity at the right lung base could represent pneumonia or asymmetric edema. Probable atelectasis and/or small effusion at the left lung base. Mild cardiomegaly is stable. Median sternotomy wires appear intact and stable in alignment. IMPRESSION: 1. New confluent airspace opacity at the right lung base, pneumonia versus asymmetric edema. Aspiration pneumonitis would be an additional possibility. 2. Probable atelectasis and/or small effusion at the left lung base. 3. Stable cardiomegaly. Electronically Signed  By: Franki Cabot M.D.   On: 03/04/2015 12:08   Ct Chest Wo Contrast  02/21/2015  CLINICAL DATA:  82 year old male with fever, weakness, cough and confusion. Right lower lobe pneumonia on chest x-ray earlier today. EXAM: CT CHEST WITHOUT CONTRAST TECHNIQUE: Multidetector CT imaging of the chest was performed following the standard protocol without IV contrast. COMPARISON:  Chest x-ray 02/15/2015 FINDINGS: Mediastinum: Motion artifact is present which could exaggerates measurements. Low right paratracheal lymph node mildly enlarged at 1.4 cm. Question of rounded soft tissue in the right hilum which could represent additional adenopathy or mass versus vascular structures and motion artifact unremarkable thoracic aorta. Visualized thyroid gland and thoracic inlet are unremarkable. No supraclavicular adenopathy. Heart/Vascular: Limited evaluation in the absence of intravenous  contrast. Conventional 3 vessel arch anatomy. Scattered calcifications including throughout the coronary arteries. Prosthetic aortic valve. Cardiomegaly with right atrial enlargement. No pericardial effusion. Lungs/Pleura: Calcified pleural plaque along the medial aspect of the right thorax. Small to moderate layering left pleural effusion. Patchy airspace opacity in a peribronchovascular distribution in the anterior aspect of the left upper lobe, and throughout the right middle and lower lobes. Respiratory motion artifact limits evaluation for underlying nodularity. Probable atelectasis in the left lower lobe. Bones/Soft Tissues: Healed median sternotomy. No acute fracture or aggressive appearing lytic or blastic osseous lesion. Upper Abdomen: Circumscribed water density 3.8 cm cyst in the liver. Sludge and stones present within the gallbladder lumen. Otherwise, imaged upper abdomen is unremarkable. IMPRESSION: 1. Patchy multifocal airspace opacities throughout the right middle and lower lobes as well as in the anterior aspect of the left upper lobe. Findings are concerning for multifocal pneumonia. Asymmetric pulmonary edema is possible but considered less likely. 2. Mildly enlarged right mediastinal lymph node and possible right hilar lymphadenopathy versus mass versus a combination of motion artifact and underlying vascular structures. Favor a combination of reactive adenopathy and motion artifact. However, recommend repeat CT scan of the chest with contrast in 2-4 weeks after appropriate course of therapy to evaluate for underlying lymphadenopathy or hilar mass. 3. Small to moderate layering left pleural effusion. 4. Chronic calcified pleural plaque along the medial and posterior aspect of the right thorax. 5. Atherosclerosis including coronary artery calcifications. 6. Prosthetic aortic valve. 7. Cardiomegaly with right atrial enlargement. 8. Hepatic cyst. 9. Cholelithiasis. Electronically Signed   By: Jacqulynn Cadet M.D.   On: 03/04/2015 14:00    Review of Systems  Constitutional: Positive for malaise/fatigue.  HENT: Positive for congestion.   Eyes: Negative.   Respiratory: Positive for shortness of breath.   Cardiovascular: Positive for palpitations.  Gastrointestinal: Negative.   Musculoskeletal: Positive for myalgias.  Skin: Negative.   Neurological: Positive for weakness.  Endo/Heme/Allergies: Negative.   Psychiatric/Behavioral: Negative.    Blood pressure 124/54, pulse 73, temperature 98.1 F (36.7 C), temperature source Axillary, resp. rate 18, height 6' 2"  (1.88 m), weight 97.024 kg (213 lb 14.4 oz), SpO2 95 %. Physical Exam  Nursing note and vitals reviewed. Constitutional: He is oriented to person, place, and time. He appears well-developed and well-nourished.  HENT:  Head: Normocephalic and atraumatic.  Eyes: Conjunctivae and EOM are normal. Pupils are equal, round, and reactive to light.  Neck: Normal range of motion. Neck supple.  Cardiovascular: Normal rate and regular rhythm.   Respiratory: Effort normal and breath sounds normal.  GI: Soft. Bowel sounds are normal.  Musculoskeletal: Normal range of motion.  Neurological: He is alert and oriented to person, place, and time. He has normal  reflexes.  Skin: Skin is warm and dry.  Psychiatric: He has a normal mood and affect.    Assessment/Plan:  shortness of breath  pneumonia  Congestive Heart Failure  Chronic obstructive pulmonary disease  mitral valve replacement mechanical  atrial fibrillation  diabetes  coronary bypass surgery  coagulopathy secondary to Coumadin  diabetes type 2 uncomplicated  hypertension . PLAN  agreed to admit to telemetry  broad-spectrum antibiotics for pneumonia  continue inhalers for shortness of breath  continue blood pressure control with diltiazem hydralazine  amiodarone for rhythm control of atrial fibrillation  Coumadin for anticoagulation  diabetes management with  insulin  continue therapy for a Chronic obstructive pulmonary disease including inhalers  consider pulmonary input  continue Coumadin for anticoagulation for mechanical mitral valve  consider echocardiogram for shortness of breath assessment mechanical mitral valve assessment  DVT prophylaxis while on Coumadin  CALLWOOD,DWAYNE D. 02/27/2015, 7:03 PM

## 2015-02-27 NOTE — Progress Notes (Signed)
Dr. Juliene Pina paged, patient increasingly confused and agitated, has become nonverbal, change from baseline this morning. MD placed orders for ABG and CT.

## 2015-02-27 NOTE — Progress Notes (Signed)
Pt transferred to CCU 11 on BiPAP, report to [facility]. Wife at bedside at time of transfer, aware of room number. RN offered to call family, but Mrs. Langenbach declined and said she would call them herself. Belongings packed and transferred with patient. Respiratory and nursing supervisor present and accompanying during transfer. Tele box returned to tele clerk.

## 2015-02-27 NOTE — Progress Notes (Signed)
ANTIBIOTIC CONSULT NOTE - INITIAL  Pharmacy Consult for Zosyn  Indication: CAP   Allergies  Allergen Reactions  . Sulfa Antibiotics Rash    Patient Measurements: Height:  (188 cm) Weight: 213 lb 14.4 oz (97.024 kg) IBW/kg (Calculated) : 82.2 Adjusted Body Weight:   Vital Signs: Temp: 97.8 F (36.6 C) (01/22 0432) Temp Source: Oral (01/22 0432) BP: 150/54 mmHg (01/22 0432) Pulse Rate: 76 (01/22 0432) Intake/Output from previous day: 01/21 0701 - 01/22 0700 In: 2115 [P.O.:240; I.V.:1775; IV Piggyback:100] Out: 525 [Urine:525] Intake/Output from this shift: Total I/O In: 258.3 [P.O.:120; I.V.:138.3] Out: 0   Labs:  Recent Labs  02/25/2015 1019 02/27/15 0436  WBC 10.6 12.3*  HGB 12.5* 12.4*  PLT 168 179  CREATININE 0.57* 0.62   Estimated Creatinine Clearance: 87.1 mL/min (by C-G formula based on Cr of 0.62). No results for input(s): VANCOTROUGH, VANCOPEAK, VANCORANDOM, GENTTROUGH, GENTPEAK, GENTRANDOM, TOBRATROUGH, TOBRAPEAK, TOBRARND, AMIKACINPEAK, AMIKACINTROU, AMIKACIN in the last 72 hours.   Microbiology: Recent Results (from the past 720 hour(s))  Culture, blood (Routine X 2) w Reflex to ID Panel     Status: None (Preliminary result)   Collection Time: 02/14/2015 11:20 AM  Result Value Ref Range Status   Specimen Description BLOOD RIGHT ANTECUBITAL  Final   Special Requests BOTTLES DRAWN AEROBIC AND ANAEROBIC  1CC  Final   Culture NO GROWTH < 24 HOURS  Final   Report Status PENDING  Incomplete  Culture, blood (Routine X 2) w Reflex to ID Panel     Status: None (Preliminary result)   Collection Time: 02/21/2015 12:07 PM  Result Value Ref Range Status   Specimen Description BLOOD RIGHT ARM  Final   Special Requests BOTTLES DRAWN AEROBIC AND ANAEROBIC  1CC  Final   Culture NO GROWTH < 24 HOURS  Final   Report Status PENDING  Incomplete    Medical History: Past Medical History  Diagnosis Date  . Hypertension   . CHF (congestive heart failure) (HCC)   .  Diabetes mellitus without complication (HCC)     diet controlled    Medications:  Prescriptions prior to admission  Medication Sig Dispense Refill Last Dose  . acetaminophen (TYLENOL) 500 MG tablet Take 1,000 mg by mouth every 6 (six) hours as needed. For pain.   02/25/2015 at Unknown time  . amiodarone (PACERONE) 200 MG tablet Take 400 mg by mouth every morning.    02/12/2015 at Unknown time  . aspirin EC 81 MG tablet Take 81 mg by mouth every morning.   02/07/2015 at Unknown time  . diltiazem (CARDIZEM CD) 300 MG 24 hr capsule Take 300 mg by mouth every morning.   02/23/2015 at Unknown time  . doxycycline (VIBRA-TABS) 100 MG tablet Take 100 mg by mouth 2 (two) times daily. X 10 days.   02/25/2015 at Unknown time  . furosemide (LASIX) 40 MG tablet Take 40 mg by mouth daily as needed. For edema.   02/21/2015  . potassium chloride SA (K-DUR,KLOR-CON) 20 MEQ tablet Take 20 mEq by mouth daily as needed.  Take when taking Furosemide.   02/21/2015  . warfarin (COUMADIN) 1 MG tablet Take 1 mg by mouth every Wednesday.   02/23/2015  . warfarin (COUMADIN) 2 MG tablet Take 2 mg by mouth See admin instructions. Take 1 tablet by mouth on Sunday, Monday, Tuesday, Thursday, Friday, and Saturday.   02/25/2015 at Unknown time   Assessment: CrCl = 87.1 ml/min  Pt was on doxycycline at home, now on levaquin  with worsening sx.    Goal of Therapy:  resolution of infection   Plan:  Expected duration 7 days with resolution of temperature and/or normalization of WBC  Will start Zosyn 4.5 gm IV Q8H EI on 1/22.   Pete Merten D 02/27/2015,10:46 AM

## 2015-02-27 NOTE — Progress Notes (Signed)
MD updated with ABG results, pt to tx to CCU on BiPAP. Respiratory notified.

## 2015-02-27 NOTE — Progress Notes (Signed)
ANTICOAGULATION CONSULT NOTE - Initial Consult  Pharmacy Consult for Coumadin dosing  Indication: atrial fibrillation  Allergies  Allergen Reactions  . Sulfa Antibiotics Rash    Patient Measurements: Height:  (188 cm) Weight: 213 lb 14.4 oz (97.024 kg) IBW/kg (Calculated) : 82.2  Vital Signs: Temp: 97.8 F (36.6 C) (01/22 0432) Temp Source: Oral (01/22 0432) BP: 150/54 mmHg (01/22 0432) Pulse Rate: 76 (01/22 0432)  Labs:  Recent Labs  02/08/2015 1019 02/19/2015 1640 03/06/2015 2217 02/27/15 0436  HGB 12.5*  --   --  12.4*  HCT 37.3*  --   --  37.7*  PLT 168  --   --  179  LABPROT 20.1*  --   --  20.4*  INR 1.71  --   --  1.75  CREATININE 0.57*  --   --  0.62  TROPONINI  --  0.03 <0.03 0.03    Estimated Creatinine Clearance: 87.1 mL/min (by C-G formula based on Cr of 0.62).   Medical History: Past Medical History  Diagnosis Date  . Hypertension   . CHF (congestive heart failure) (HCC)   . Diabetes mellitus without complication (HCC)     diet controlled    Medications:  Scheduled:  . amiodarone  400 mg Oral BH-q7a  . aspirin EC  81 mg Oral BH-q7a  . diltiazem  300 mg Oral BH-q7a  . docusate sodium  100 mg Oral BID  . furosemide  40 mg Intravenous Once  . furosemide  40 mg Oral Daily  . heparin  5,000 Units Subcutaneous 3 times per day  . insulin aspart  0-9 Units Subcutaneous 6 times per day  . ipratropium-albuterol  3 mL Nebulization Q6H  . levofloxacin (LEVAQUIN) IV  500 mg Intravenous Q24H  . piperacillin-tazobactam (ZOSYN)  IV  4.5 g Intravenous 3 times per day  . potassium chloride SA  20 mEq Oral Daily  . sodium chloride  3 mL Intravenous Q12H  . warfarin  2.5 mg Oral q1800    Assessment: 82 year old male with a history of ASCVD status post CABG, atrial fibrillation on Coumadin  daily prior to admission.  1/21  INR 1.71, Coumadin  1/22  INR 1.75  Goal of Therapy:  INR 2-3   Plan:  Increased Coumadin dose to 2.5mg  PO daily.    Ordered INR for am.    Pharmacy will continue to monitor and adjust dose as appropriate.      Stormy Card, [facility] Clinical Pharmacist  02/27/2015,11:39 AM

## 2015-02-27 NOTE — Progress Notes (Signed)
PT Cancellation Note  Patient Details Name: James [name] MRN: [id number] DOB: May 15, 1933   Cancelled Treatment:    Reason Eval/Treat Not Completed: Medical issues which prohibited therapy;Other (comment) (Hypercarbia, Bipap and pulled to ICU.  Will need new order) due to medical changes.  Will ask MD to reorder when appropriate.   [name] 02/27/2015, 2:24 PM   [name], PT MS Acute Rehab Dept. Number: [facility] [id number] and [facility] [phone]

## 2015-02-28 MED ORDER — LORAZEPAM 2 MG/ML IJ SOLN
INTRAMUSCULAR | Status: AC
Start: 2015-02-28 — End: 2015-02-28
  Administered 2015-02-28: 1 mg via INTRAVENOUS
  Filled 2015-02-28: qty 1

## 2015-02-28 MED ORDER — GLYCOPYRROLATE 1 MG PO TABS
1.0000 mg | ORAL_TABLET | ORAL | Status: DC | PRN
  Filled 2015-02-28: qty 1

## 2015-02-28 MED ORDER — HALOPERIDOL LACTATE 2 MG/ML PO CONC
0.5000 mg | ORAL | Status: DC | PRN
  Filled 2015-02-28: qty 0.3

## 2015-02-28 MED ORDER — MORPHINE SULFATE 25 MG/ML IV SOLN: 1.0000 mg/h | INTRAVENOUS | Status: DC

## 2015-02-28 MED ORDER — ONDANSETRON HCL 4 MG/2ML IJ SOLN: 4.0000 mg | Freq: Four times a day (QID) | INTRAMUSCULAR | Status: DC | PRN

## 2015-02-28 MED ORDER — BIOTENE DRY MOUTH MT LIQD: 15.0000 mL | OROMUCOSAL | Status: DC | PRN

## 2015-02-28 MED ORDER — MORPHINE 100MG IN NS 100ML (1MG/ML) PREMIX INFUSION
1.0000 mg/h | INTRAVENOUS | Status: DC
  Administered 2015-02-28: 1 mg/h via INTRAVENOUS
  Filled 2015-02-28: qty 100

## 2015-02-28 MED ORDER — ACETAMINOPHEN 325 MG PO TABS: 650.0000 mg | ORAL_TABLET | Freq: Four times a day (QID) | ORAL | Status: DC | PRN

## 2015-02-28 MED ORDER — GLYCOPYRROLATE 0.2 MG/ML IJ SOLN
0.2000 mg | INTRAMUSCULAR | Status: DC | PRN
  Filled 2015-02-28: qty 1

## 2015-02-28 MED ORDER — HALOPERIDOL 0.5 MG PO TABS: 0.5000 mg | ORAL_TABLET | ORAL | Status: DC | PRN

## 2015-02-28 MED ORDER — LORAZEPAM 0.5 MG PO TABS: 1.0000 mg | ORAL_TABLET | ORAL | Status: DC | PRN

## 2015-02-28 MED ORDER — ACETAMINOPHEN 650 MG RE SUPP: 650.0000 mg | Freq: Four times a day (QID) | RECTAL | Status: DC | PRN

## 2015-02-28 MED ORDER — POLYVINYL ALCOHOL 1.4 % OP SOLN
1.0000 [drp] | Freq: Four times a day (QID) | OPHTHALMIC | Status: DC | PRN
  Filled 2015-02-28: qty 15

## 2015-02-28 MED ORDER — HALOPERIDOL LACTATE 5 MG/ML IJ SOLN
0.5000 mg | INTRAMUSCULAR | Status: DC | PRN
  Administered 2015-02-28: 0.5 mg via INTRAVENOUS
  Filled 2015-02-28: qty 1

## 2015-02-28 MED ORDER — LORAZEPAM 2 MG/ML IJ SOLN
1.0000 mg | INTRAMUSCULAR | Status: DC | PRN
  Administered 2015-02-28: 1 mg via INTRAVENOUS
  Filled 2015-02-28: qty 1

## 2015-02-28 MED ORDER — LORAZEPAM 2 MG/ML PO CONC
1.0000 mg | ORAL | Status: DC | PRN
  Filled 2015-02-28: qty 0.5

## 2015-02-28 MED ORDER — LORAZEPAM 2 MG/ML IJ SOLN
0.5000 mg | Freq: Once | INTRAMUSCULAR | Status: AC
  Administered 2015-02-28: 1 mg via INTRAVENOUS

## 2015-02-28 MED ORDER — ONDANSETRON 4 MG PO TBDP
4.0000 mg | ORAL_TABLET | Freq: Four times a day (QID) | ORAL | Status: DC | PRN
  Filled 2015-02-28: qty 1

## 2015-03-03 LAB — CULTURE, BLOOD (ROUTINE X 2)
CULTURE: NO GROWTH
Culture: NO GROWTH

## 2015-03-09 NOTE — Progress Notes (Signed)
Pt. Expired at 0150 this morning with wife bedside.

## 2015-03-09 NOTE — Progress Notes (Signed)
Pt. Became increasingly agitated and the PRN morphine was not helpful.  Pt. Combative and injured L foot as well as B elbows.  Called Dr. Anne Hahn requesting additional medication, will administer ativan PRN.

## 2015-03-09 NOTE — Progress Notes (Signed)
Called by nursing that patient's family decided to place patient on comfort measures.  Spoke with patient's wife, who stated she understands the process and states that her husband would not have wanted aggressive treatment.  Comfort order set initiated.  Kristeen Miss [facility] 02/19/2015, 12:50 AM

## 2015-03-09 NOTE — Progress Notes (Signed)
Called Dr. Anne Hahn requesting order for bear hugger warming blanket for increased pt. Comfort.

## 2015-03-09 NOTE — Discharge Summary (Signed)
Patient was admitted on Mar 18, 2015 and was pronounced deceased on 2015/03/20 at 47:82 AM.  82 year old male with a history of ASCVD status post CABG, atrial fibrillation on Coumadin, COPD on home oxygen and diabetes who presented with confusion, cough and weakness  1. Acute on chronic respiratory failure with hypoxia: This was thought to be secondary to community-acquired pneumonia. Patient was initially started on Levaquin and due to the fact the patient had an ABG showing severe respiratory acidosis, antibiotics were broadened. Pulmonary was consulted. Patient was DO NOT RESUSCITATE. Patient was tried on BiPAP.  Due to critical illness and ongoing respiratory failure family had decided that they wanted the patient to be placed on comfort care measures. Patient was pronounced on 2024-02-12at 1:50 AM. Family was at bedside.  2. Hyponatremia: This was thought to be due to underlying pneumonia  3. History of atrial fibrillation: Patient was on diltiazem and amiodarone as well as Coumadin  4. her chronic diastolic heart failure  5. Diet controlled diabetes  6. CAD

## 2015-03-09 NOTE — Progress Notes (Signed)
Called and spoke w/ Dr. Vassie Loll @ [facility] r/t pt.'s agitation w/ any intervention.  Gave pt. Morphine PRN but requested additional pain/anxiety medication to help pt. Stay calm.   No new orders @ this time, will continue to monitor pt. Closely and call back if pt. Is having increased pain/agitation in the future.

## 2015-03-09 NOTE — Progress Notes (Signed)
Ativan PRN not helpful, pt.'s wife decided to initiate comfort care measures.  Called Dr. Anne Hahn, who spoke with the wife confirming comfort care measures. RN suggested morphine gtt for pt. Comfort.

## 2015-03-09 DEATH — deceased

## 2016-11-15 IMAGING — CT CT CHEST W/O CM
2 of 3 series · 16 of 46 positions shown, 18 images · non-contrast
Comparison: Chest x-ray 02/26/2015

CLINICAL DATA: 81-year-old male with fever, weakness, cough and
confusion. Right lower lobe pneumonia on chest x-ray earlier today.

EXAM:
CT CHEST WITHOUT CONTRAST
TECHNIQUE: Multidetector CT imaging of the chest was performed following the
standard protocol without IV contrast.

[Series 2: routine chest wo · axial · 0.77mm/px · z∈[-749,-464]mm · 13 of 67 slices shown, 15 images]
[im 5/67  soft-tissue]
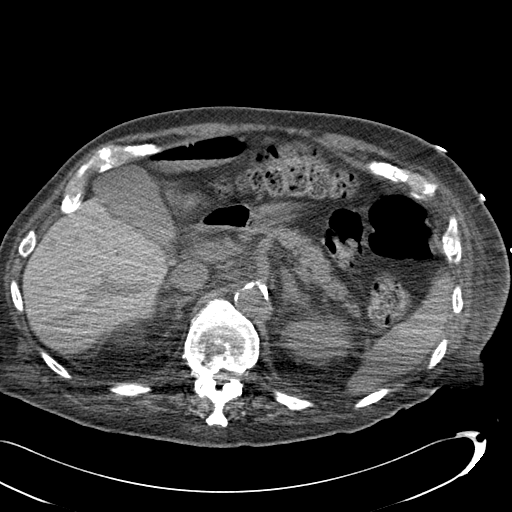
[im 5/67  bone]
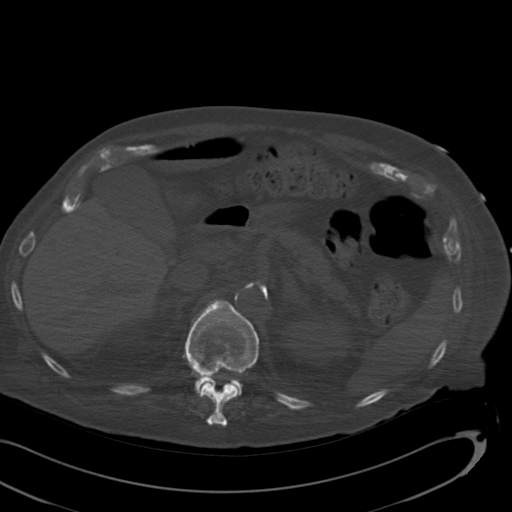
[im 9/67  soft-tissue]
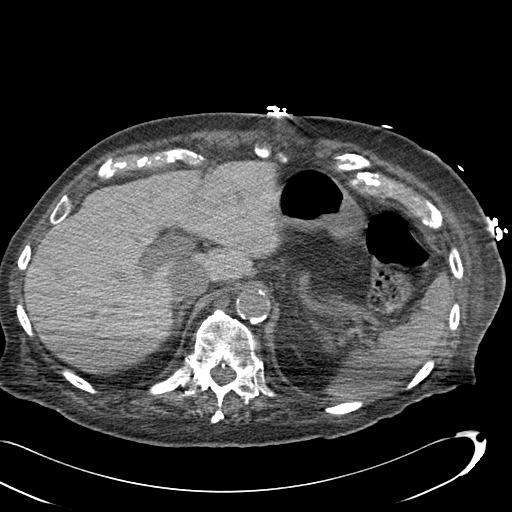
[im 13/67  soft-tissue]
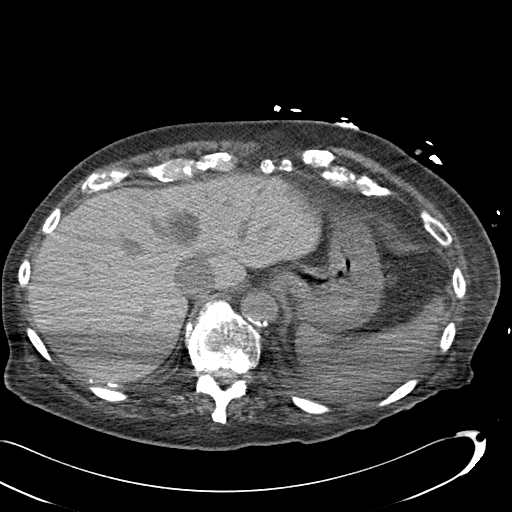
[im 20/67  soft-tissue]
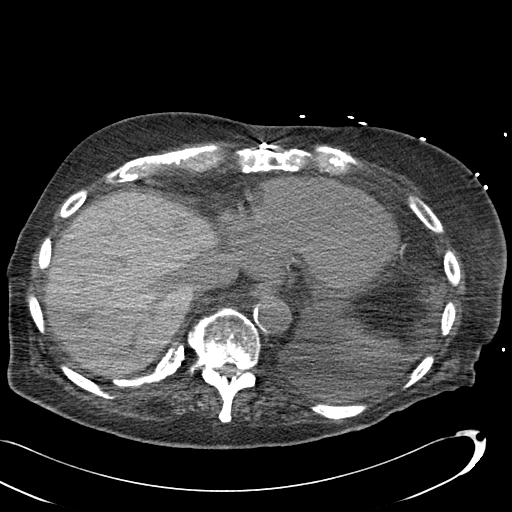
[im 24/67  soft-tissue]
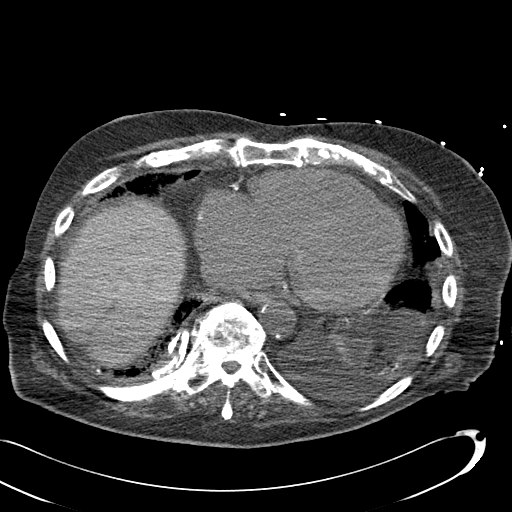
[im 28/67  soft-tissue]
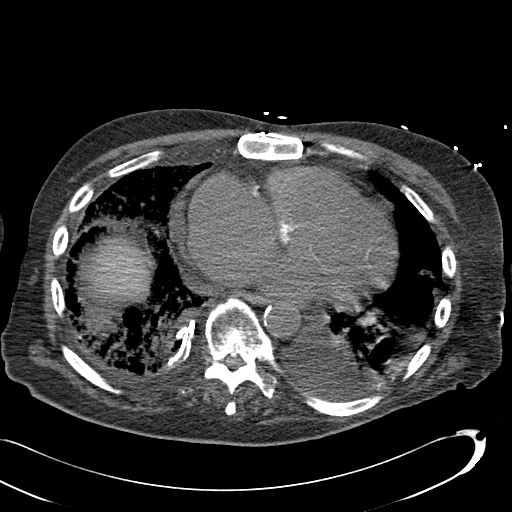
[im 35/67  soft-tissue]
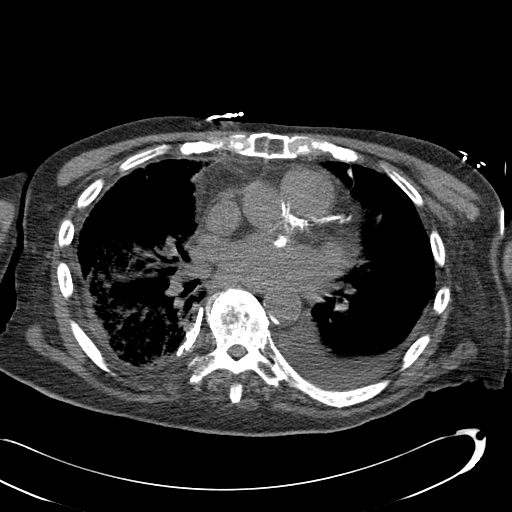
[im 39/67  soft-tissue]
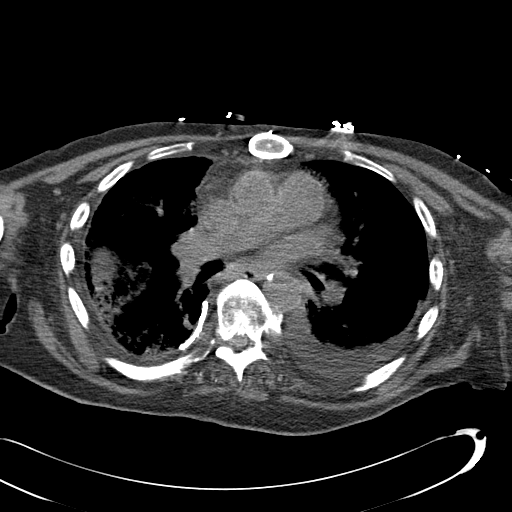
[im 43/67  soft-tissue]
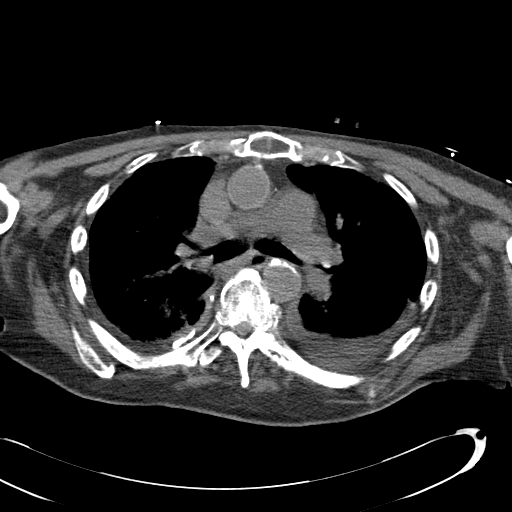
[im 43/67  bone]
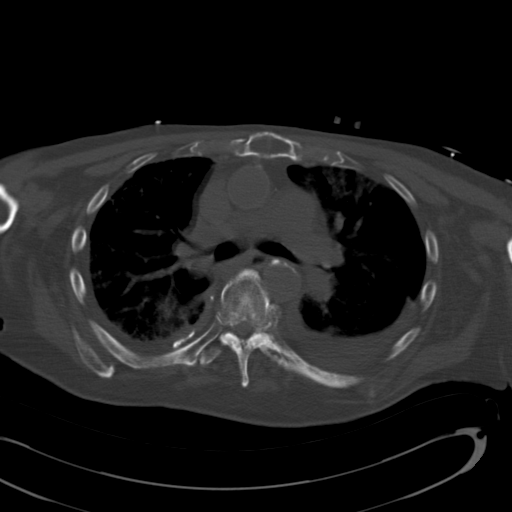
[im 47/67  soft-tissue]
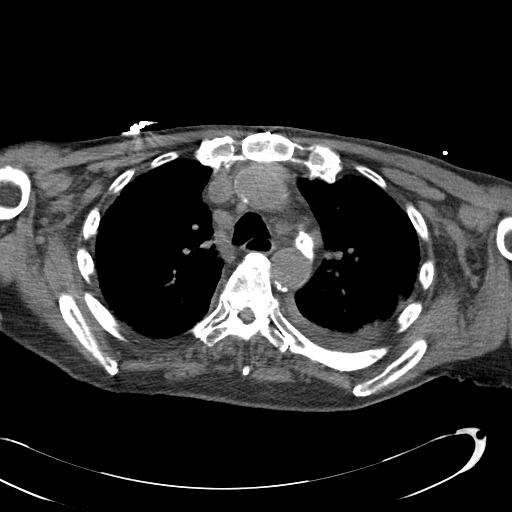
[im 54/67  soft-tissue]
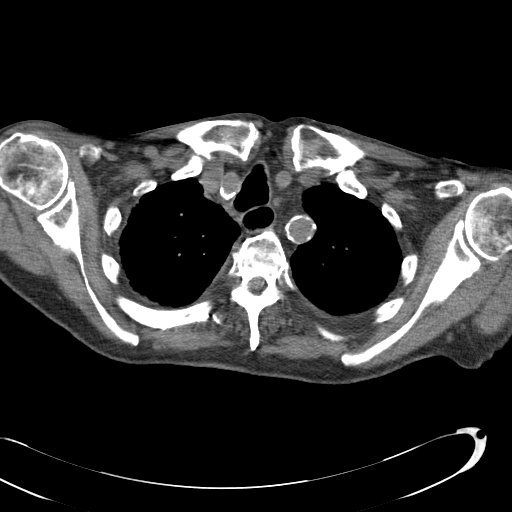
[im 58/67  soft-tissue]
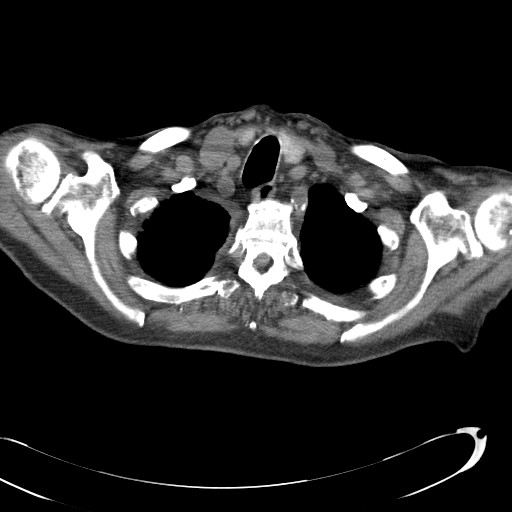
[im 62/67  soft-tissue]
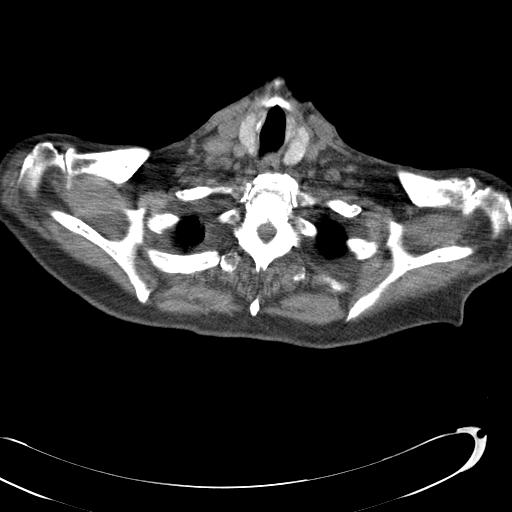

[Series 5: routine chest wo cor · coronal · 0.77mm/px · 3 of 124 slices shown]
[im 42/124  soft-tissue]
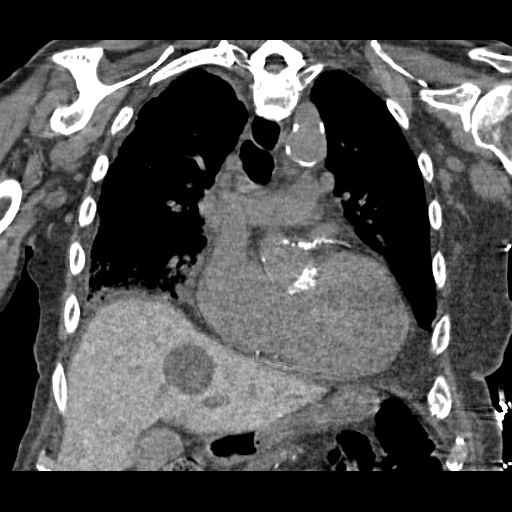
[im 55/124  soft-tissue]
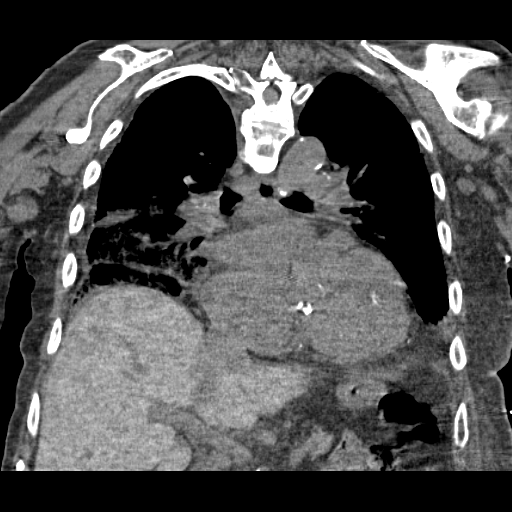
[im 69/124  soft-tissue]
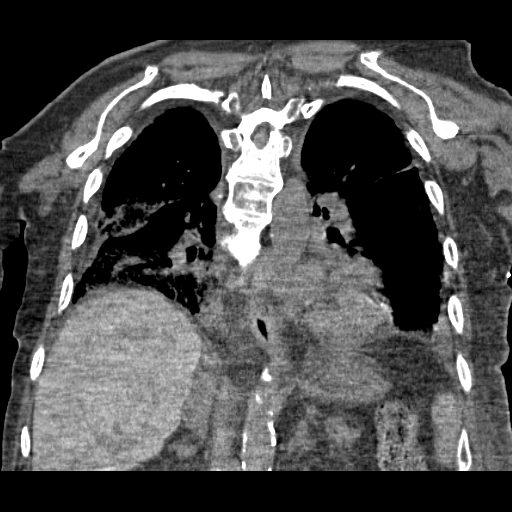

[16 of 46 positions shown; findings below may reference images not displayed]

FINDINGS: Mediastinum: Motion artifact is present which could exaggerates
measurements. Low right paratracheal lymph node mildly enlarged at
1.4 cm. Question of rounded soft tissue in the right hilum which
could represent additional adenopathy or mass versus vascular
structures and motion artifact unremarkable thoracic aorta.
Visualized thyroid gland and thoracic inlet are unremarkable. No
supraclavicular adenopathy.

Heart/Vascular: Limited evaluation in the absence of intravenous
contrast. Conventional 3 vessel arch anatomy. Scattered
calcifications including throughout the coronary arteries.
Prosthetic aortic valve. Cardiomegaly with right atrial enlargement.
No pericardial effusion.

Lungs/Pleura: Calcified pleural plaque along the medial aspect of
the right thorax. Small to moderate layering left pleural effusion.
Patchy airspace opacity in a peribronchovascular distribution in the
anterior aspect of the left upper lobe, and throughout the right
middle and lower lobes. Respiratory motion artifact limits
evaluation for underlying nodularity. Probable atelectasis in the
left lower lobe.

Bones/Soft Tissues: Healed median sternotomy. No acute fracture or
aggressive appearing lytic or blastic osseous lesion.

Upper Abdomen: Circumscribed water density 3.8 cm cyst in the liver.
Sludge and stones present within the gallbladder lumen. Otherwise,
imaged upper abdomen is unremarkable.
IMPRESSION: 1. Patchy multifocal airspace opacities throughout the right middle
and lower lobes as well as in the anterior aspect of the left upper
lobe. Findings are concerning for multifocal pneumonia. Asymmetric
pulmonary edema is possible but considered less likely.
2. Mildly enlarged right mediastinal lymph node and possible right
hilar lymphadenopathy versus mass versus a combination of motion
artifact and underlying vascular structures. Favor a combination of
reactive adenopathy and motion artifact. However, recommend repeat
CT scan of the chest with contrast in 2-4 weeks after appropriate
course of therapy to evaluate for underlying lymphadenopathy or
hilar mass.
3. Small to moderate layering left pleural effusion.
4. Chronic calcified pleural plaque along the medial and posterior
aspect of the right thorax.
5. Atherosclerosis including coronary artery calcifications.
6. Prosthetic aortic valve.
7. Cardiomegaly with right atrial enlargement.
8. Hepatic cyst.
9. Cholelithiasis.

## 2016-11-16 IMAGING — CR DG CHEST 2V
1 series · 2 of 2 positions shown · non-contrast
Comparison: Chest CT yesterday 02/26/2015 and earlier

CLINICAL DATA: 81-year-old male with confusion shortness of breath
and cough. Initial encounter.

EXAM:
CHEST  2 VIEW

[Series 1: dg chest 2 view · 0.14mm/px · 2 of 2 slices shown]
[im 1/2]
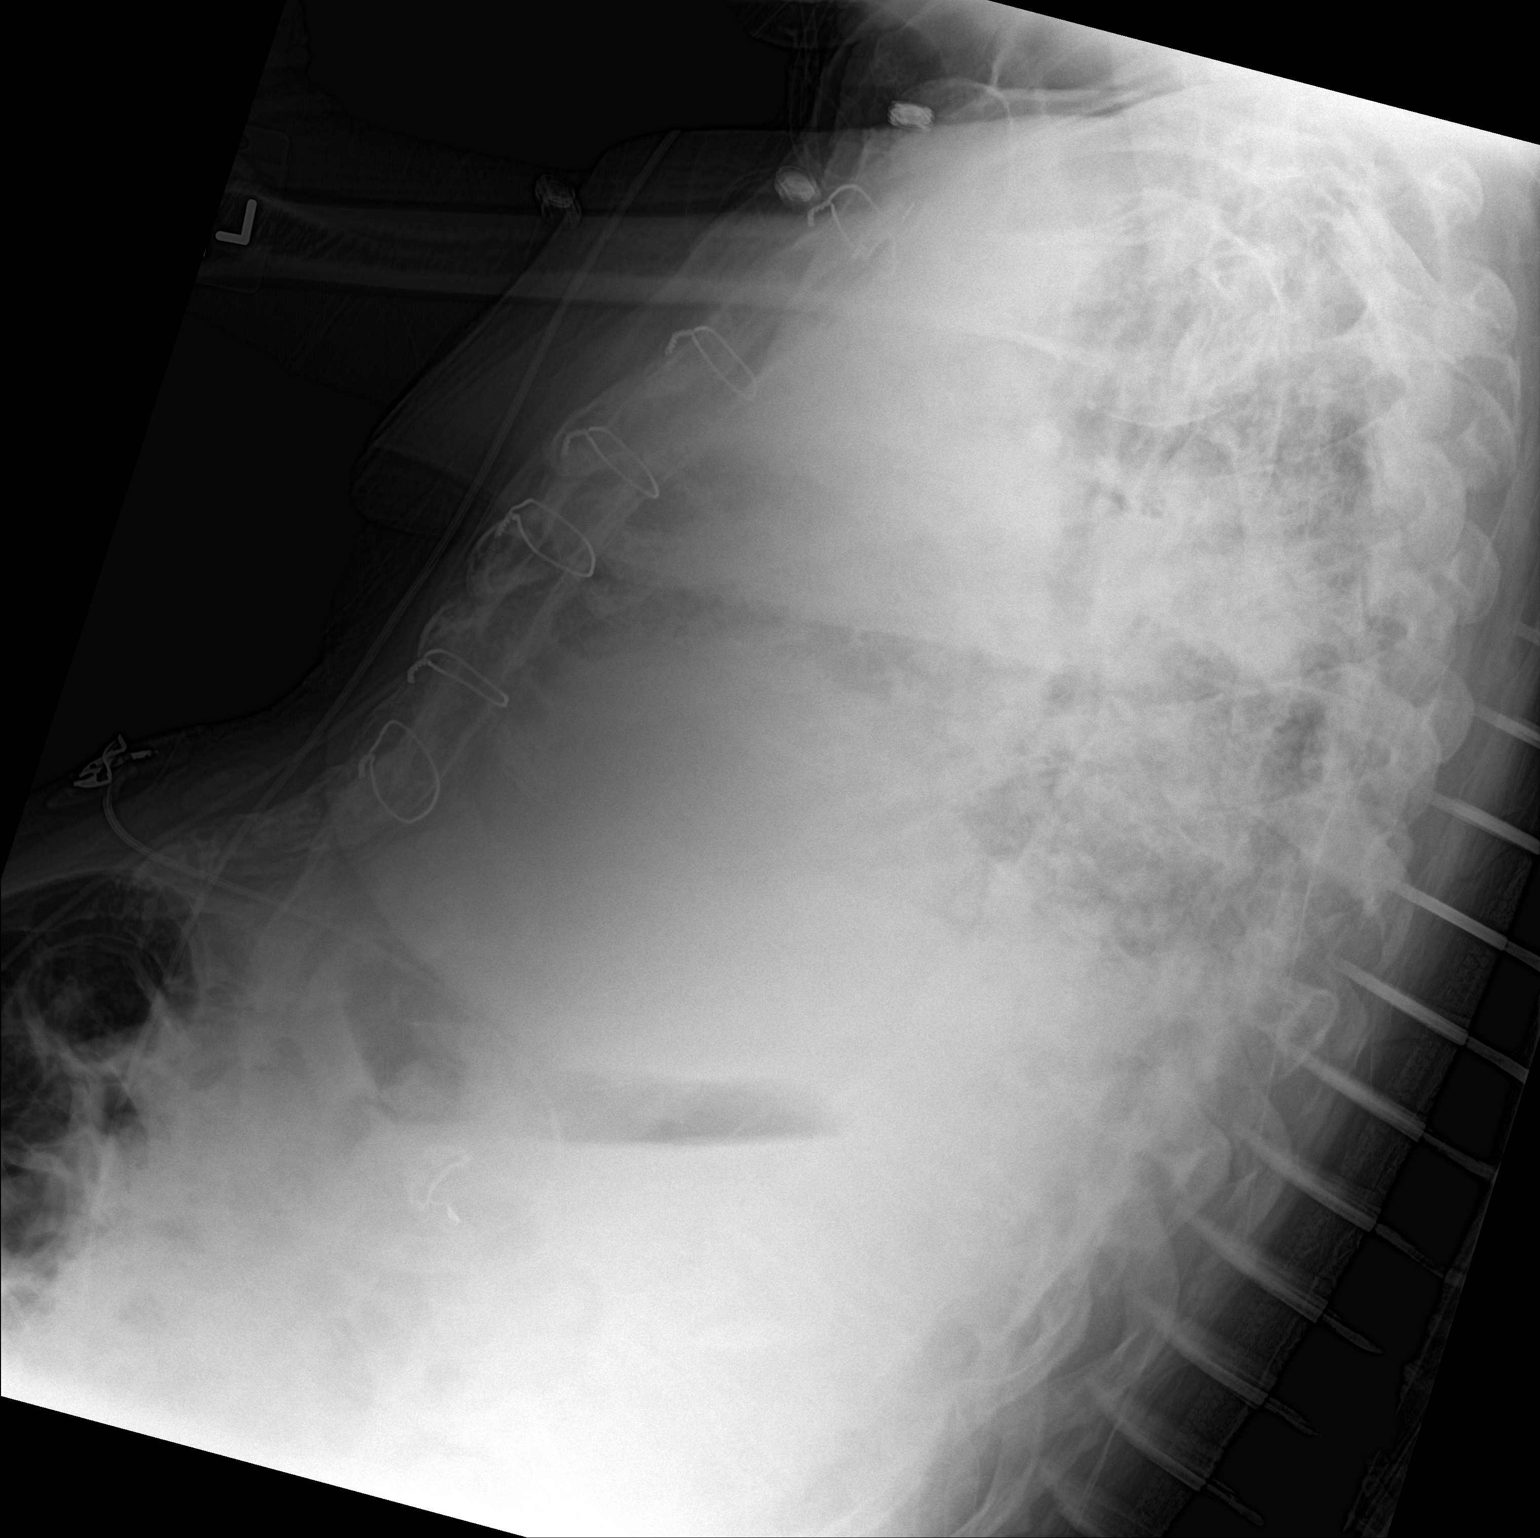
[im 2/2]
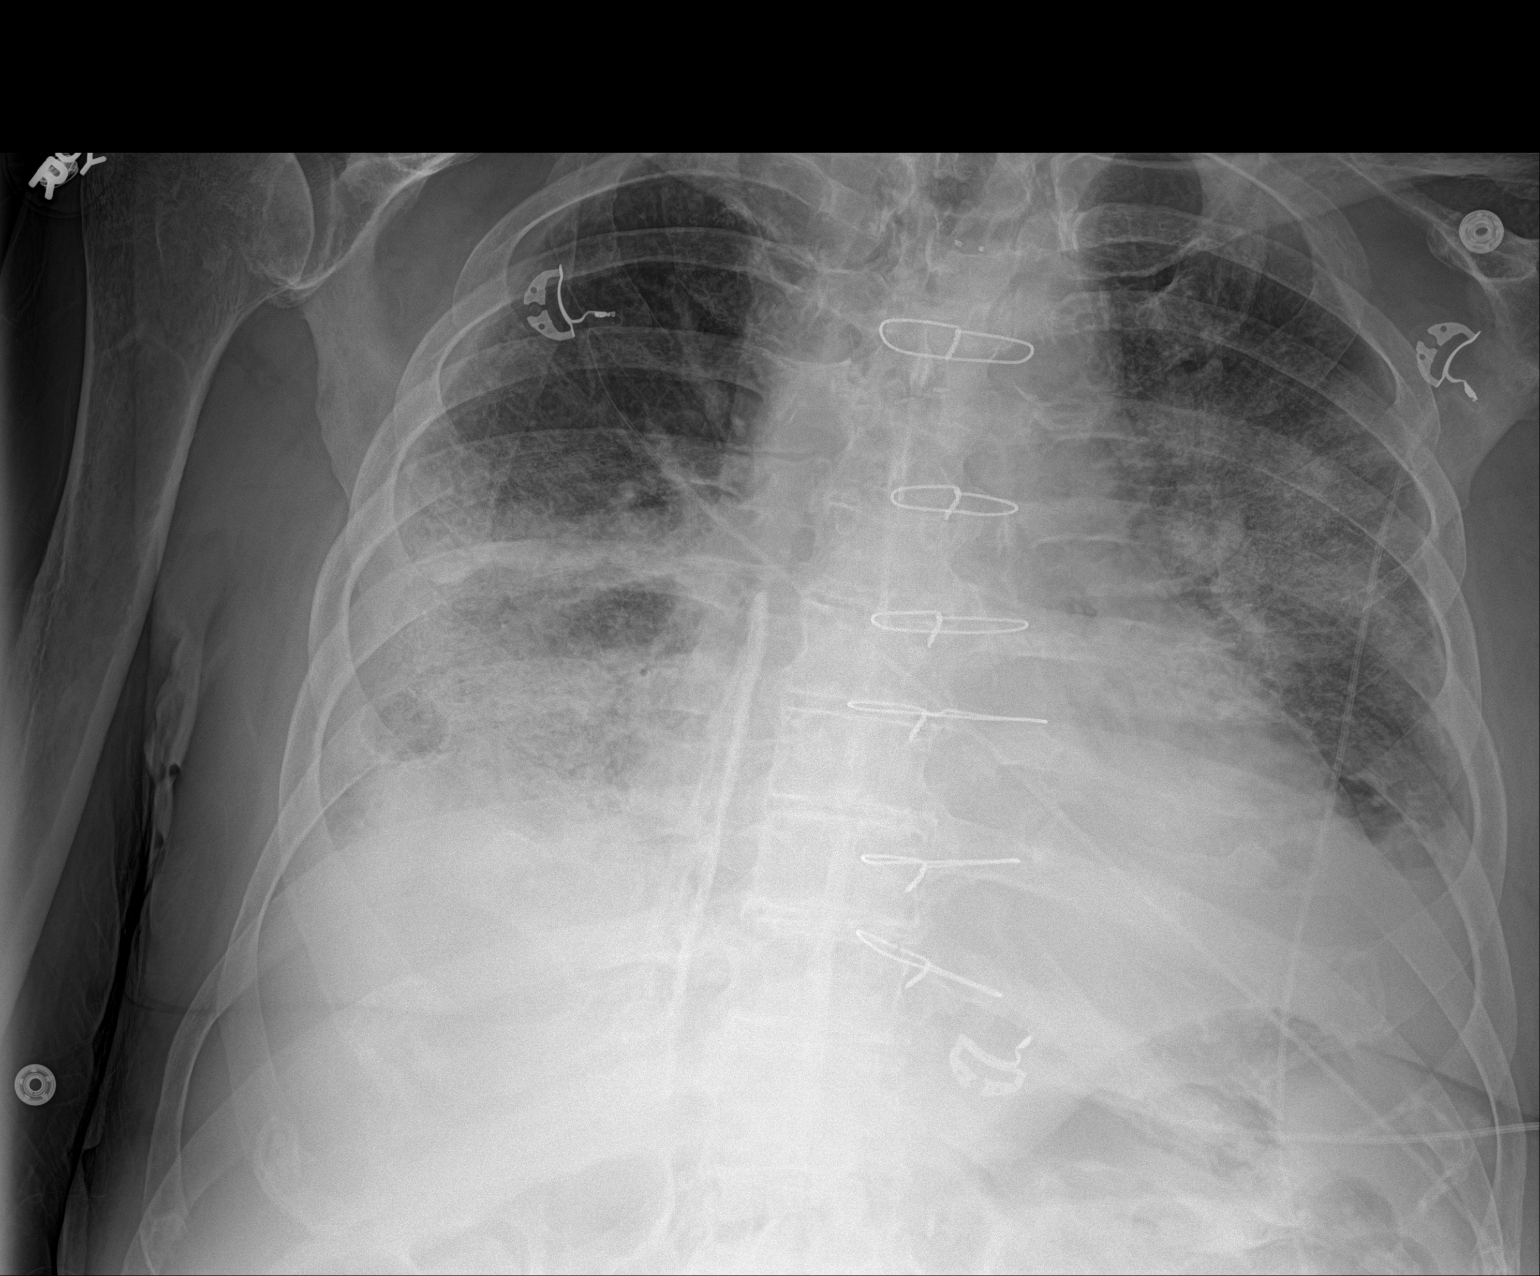

[2 of 2 positions shown; findings below may reference images not displayed]

FINDINGS: Seated AP and lateral views of the chest. Confluent calcified right
pleural plaques along the spine re- demonstrated. Bilateral
confluent airspace disease, progressed radiographically in the left
lung since [DATE] hours yesterday. Small left greater than right
pleural effusions re- demonstrated. No superimposed pneumothorax.
Stable cardiac size and mediastinal contours. Calcified aortic
atherosclerosis.
IMPRESSION: Radiographically progressed bilateral pneumonia since 9915 hours
yesterday. Small bilateral pleural effusions.
